# Patient Record
Sex: Male | Born: 1990
Health system: Southern US, Community
[De-identification: ages and names within clinical notes are randomized; demographics above are authoritative.]

## PROBLEM LIST (undated history)

## (undated) ENCOUNTER — Inpatient Hospital Stay: Payer: Self-pay

## (undated) DIAGNOSIS — F909 Attention-deficit hyperactivity disorder, unspecified type: Secondary | ICD-10-CM

## (undated) DIAGNOSIS — N200 Calculus of kidney: Secondary | ICD-10-CM

## (undated) HISTORY — PX: OTHER SURGICAL HISTORY: SHX169

---
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
## 2011-12-06 DIAGNOSIS — F900 Attention-deficit hyperactivity disorder, predominantly inattentive type: Secondary | ICD-10-CM | POA: Insufficient documentation

## 2012-06-11 ENCOUNTER — Other Ambulatory Visit: Payer: Self-pay | Admitting: Infectious Diseases

## 2012-06-11 ENCOUNTER — Other Ambulatory Visit (INDEPENDENT_AMBULATORY_CARE_PROVIDER_SITE_OTHER): Payer: 59

## 2012-06-11 DIAGNOSIS — Z0000 Encounter for general adult medical examination without abnormal findings: Secondary | ICD-10-CM

## 2012-06-11 NOTE — Addendum Note (Signed)
Addended by: Bufford Spikes on: 06/11/2012 03:42 PM   Modules accepted: Orders

## 2012-06-12 LAB — VARICELLA ZOSTER ANTIBODY, IGG: Varicella IgG: 3394 Index — ABNORMAL HIGH (ref <135.00)

## 2012-06-13 ENCOUNTER — Encounter (HOSPITAL_COMMUNITY): Payer: Self-pay | Admitting: *Deleted

## 2012-06-13 ENCOUNTER — Emergency Department (HOSPITAL_COMMUNITY)
Admission: EM | Admit: 2012-06-13 | Discharge: 2012-06-13 | Disposition: A | Discharge status: Home / Self Care | Payer: 59 | Source: Home / Self Care | Attending: Family Medicine | Admitting: Family Medicine

## 2012-06-13 DIAGNOSIS — S40269A Insect bite (nonvenomous) of unspecified shoulder, initial encounter: Secondary | ICD-10-CM

## 2012-06-13 DIAGNOSIS — W57XXXA Bitten or stung by nonvenomous insect and other nonvenomous arthropods, initial encounter: Secondary | ICD-10-CM

## 2012-06-13 HISTORY — DX: Calculus of kidney: N20.0

## 2012-06-13 MED ORDER — DOXYCYCLINE HYCLATE 100 MG PO CAPS: 100.0000 mg | ORAL_CAPSULE | Freq: Two times a day (BID) | ORAL | Status: DC

## 2012-06-13 NOTE — ED Provider Notes (Signed)
Medical screening examination/treatment/procedure(s) were performed by resident physician or non-physician practitioner and as supervising physician I was immediately available for consultation/collaboration.   KINDL,JAMES DOUGLAS MD.   James D Kindl, MD 06/13/12 2046 

## 2012-06-13 NOTE — ED Notes (Signed)
Pt here c/o rash on his RIGHT shoulder s/p removing a tick from same location x1 day ago. Pt reports tick may have been on this location x2 days - was outside then. Pt's father is pharmacist, removed tick, reports same appeared to be fully engorged. Rash is round, red, slightly raised, has no noted streaking from it, and measures 0.75cm in diameter. Denies fever.

## 2012-06-13 NOTE — ED Provider Notes (Signed)
History     CSN: [id number]  Arrival date & time 06/13/12  1703   First MD Initiated Contact with Patient 06/13/12 1846      Chief Complaint  Patient presents with  . Rash    (Consider location/radiation/quality/duration/timing/severity/associated sxs/prior treatment) HPI Comments: Pt's father removed an engorged tick from the back of his right shoulder yesterday. Pt unsure of how long tick had been there.  Today has small bull's eye rash in area of bite. Denies any other sx.   Patient is a 22 y.o. male presenting with rash. The history is provided by the patient.  Rash Location:  Shoulder/arm Shoulder/arm rash location:  R shoulder Quality: redness   Severity:  Mild Onset quality:  Unable to specify Timing:  Constant Progression:  Unchanged Chronicity:  New Context: insect bite/sting   Relieved by:  None tried Worsened by:  Nothing tried Ineffective treatments:  None tried Associated symptoms: no fever, no joint pain and no myalgias     Past Medical History  Diagnosis Date  . ADD (attention deficit disorder with hyperactivity)   . Kidney calculi     Past Surgical History  Procedure Laterality Date  . Basket uteroscopy  approx "2-3 years ago"    History reviewed. No pertinent family history.  History  Substance Use Topics  . Smoking status: Never Smoker   . Smokeless tobacco: Not on file  . Alcohol Use: Yes      Review of Systems  Constitutional: Negative for fever and chills.  Musculoskeletal: Negative for myalgias and arthralgias.  Skin: Positive for rash.    Allergies  Review of patient's allergies indicates no known allergies.  Home Medications   Current Outpatient Rx  Name  Route  Sig  Dispense  Refill  . amphetamine-dextroamphetamine (ADDERALL) 20 MG tablet   Oral   Take 20 mg by mouth daily as needed. Just takes when needs to study          . doxycycline (VIBRAMYCIN) 100 MG capsule   Oral   Take 1 capsule (100 mg total) by mouth 2  (two) times daily.   20 capsule   0     BP 109/66  Pulse 62  Temp(Src) 98.6 F (37 C) (Oral)  Resp 14  SpO2 100%  Physical Exam  Constitutional: He appears well-developed and well-nourished. No distress.  Skin: Skin is warm and dry. There is erythema.  Small 1cm rash area R shoulder. Center area is red, surrounded by area of clearing, and with a red border around the rash c/w in appearance with a bull's eye.     ED Course  Procedures (including critical care time)  Labs Reviewed - No data to display No results found.   1. Tick bite of shoulder, right, initial encounter       MDM  Pt asymptomatic. Rx doxycycline 100mg  BID #20.         Cathlyn Parsons, NP 06/13/12 [phone]

## 2012-08-15 ENCOUNTER — Encounter (INDEPENDENT_AMBULATORY_CARE_PROVIDER_SITE_OTHER): Payer: Self-pay

## 2012-08-15 ENCOUNTER — Ambulatory Visit (INDEPENDENT_AMBULATORY_CARE_PROVIDER_SITE_OTHER): Payer: Commercial Managed Care - PPO | Admitting: General Surgery

## 2012-08-15 ENCOUNTER — Encounter (INDEPENDENT_AMBULATORY_CARE_PROVIDER_SITE_OTHER): Payer: Self-pay | Admitting: General Surgery

## 2012-08-15 VITALS — BP 118/68 | HR 66 | Temp 99.0°F | Resp 16 | Ht 71.0 in | Wt 144.8 lb

## 2012-08-15 DIAGNOSIS — IMO0002 Reserved for concepts with insufficient information to code with codable children: Secondary | ICD-10-CM

## 2012-08-15 DIAGNOSIS — S76219A Strain of adductor muscle, fascia and tendon of unspecified thigh, initial encounter: Secondary | ICD-10-CM | POA: Insufficient documentation

## 2012-08-15 NOTE — Patient Instructions (Signed)
Avoid unbalanced heavy lifting for 6 weeks.
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
## 2014-04-02 ENCOUNTER — Encounter (HOSPITAL_COMMUNITY): Payer: Self-pay | Admitting: Psychiatry

## 2014-04-02 ENCOUNTER — Ambulatory Visit (INDEPENDENT_AMBULATORY_CARE_PROVIDER_SITE_OTHER): Payer: 59 | Admitting: Psychiatry

## 2014-04-02 ENCOUNTER — Encounter (INDEPENDENT_AMBULATORY_CARE_PROVIDER_SITE_OTHER): Payer: Self-pay

## 2014-04-02 VITALS — BP 142/71 | HR 74 | Ht 71.0 in | Wt 142.0 lb

## 2014-04-02 DIAGNOSIS — F4323 Adjustment disorder with mixed anxiety and depressed mood: Secondary | ICD-10-CM | POA: Diagnosis not present

## 2014-04-02 MED ORDER — CLONAZEPAM 0.5 MG PO TABS
ORAL_TABLET | ORAL | Status: DC
Start: 2014-04-02 — End: 2014-06-11
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
## 2014-05-04 DIAGNOSIS — F411 Generalized anxiety disorder: Secondary | ICD-10-CM | POA: Insufficient documentation

## 2014-06-11 ENCOUNTER — Ambulatory Visit (INDEPENDENT_AMBULATORY_CARE_PROVIDER_SITE_OTHER): Payer: 59 | Admitting: Psychiatry

## 2014-06-11 ENCOUNTER — Encounter (HOSPITAL_COMMUNITY): Payer: Self-pay | Admitting: Psychiatry

## 2014-06-11 VITALS — BP 111/74 | HR 56 | Ht 70.0 in | Wt 150.0 lb

## 2014-06-11 DIAGNOSIS — F4322 Adjustment disorder with anxiety: Secondary | ICD-10-CM | POA: Diagnosis not present

## 2014-06-11 DIAGNOSIS — F4323 Adjustment disorder with mixed anxiety and depressed mood: Secondary | ICD-10-CM

## 2014-06-11 NOTE — Progress Notes (Signed)
[facility] Progress Note  06/11/2014 10:07 AM Al DecantChristopher F Georgiou  MRN:  [id number] Subjective:  Doing great The patient did well on his trip in [place]. He is graduated from his college. He's going to take some more courses at [employer] to be prepared to apply to PA school at [employer]. The patient's mood is great. His anxiety is gone. The Klonopin was very helpful. At this time the patient has tapered himself down and is off the Klonopin. He is sleeping and eating well has good energy is positive and focused. He doesn't use any alcohol or use any drugs. He is very functional. He demonstrates no evidence of psychosis. 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 The patient wonders after PA school if he should've echo to medical school but he'll find out. The patient does very well at school and at this time is asymptomatic from psychiatric symptoms. I think his anxiety disorder was transient. Principal Problem: Adjustment disorder with anxious mood state Diagnosis:   Patient Active Problem List   Diagnosis Date Noted  . Groin strain-right [S39.011A] 08/15/2012  . CHEST PAIN [R07.9] 04/20/2009   Total Time spent with patient: 30 minutes   Past Medical History:  Past Medical History  Diagnosis Date  . ADD (attention deficit disorder with hyperactivity)   . Kidney calculi     Past Surgical History  Procedure Laterality Date  . Basket uteroscopy  approx "2-3 years ago"   Family History:  Family History  Problem Relation Age of Onset  . Cancer Mother     lung  . Cancer Maternal Grandfather     lung  . Anxiety disorder Father   . Depression Father   . Anxiety disorder Sister   . Depression Sister   . Dementia Maternal Grandmother   . Alcohol abuse Paternal Grandfather    Social History:  History  Alcohol Use  . 0.6 oz/week  . 1 Cans of beer per week    Comment: occasional     History  Drug Use No    History   Social History  .  Marital Status: Single    Spouse Name: N/A  . Number of Children: N/A  . Years of Education: N/A   Social History Main Topics  . Smoking status: Never Smoker   . Smokeless tobacco: Never Used  . Alcohol Use: 0.6 oz/week    1 Cans of beer per week     Comment: occasional  . Drug Use: No  . Sexual Activity: Yes    Birth Control/ Protection: Condom   Other Topics Concern  . None   Social History Narrative   Additional History:    Sleep: Good  Appetite:  Good   Assessment:   Musculoskeletal: Strength & Muscle Tone:  Gait & Station:  Patient leans:    Psychiatric Specialty Exam: Physical Exam  ROS  Blood pressure 111/74, pulse 56, height 5\' 10"  (1.778 m), weight 150 lb (68.04 kg).Body mass index is 21.52 kg/(m^2).  General Appearance: Casual  Eye Contact::  Good  Speech:  Clear and Coherent  Volume:  Normal  Mood:  Euthymic  Affect:  Congruent  Thought Process:  Coherent  Orientation:  Full (Time, Place, and Person)  Thought Content:  WDL  Suicidal Thoughts:  No  Homicidal Thoughts:  No  Memory:  NA  Judgement:  Good  Insight:  Good  Psychomotor Activity:  Normal  Concentration:  Good  Recall:  Dudley MajorGood  Fund of Knowledge:Good  Language: Good  Akathisia:  No  Handed:  Right  AIMS (if indicated):     Assets:  Desire for Improvement  ADL's:  Intact  Cognition: WNL  Sleep:        Current Medications: Current Outpatient Prescriptions  Medication Sig Dispense Refill  . amphetamine-dextroamphetamine (ADDERALL) 20 MG tablet Take 20 mg by mouth daily as needed. Just takes when needs to study     . clonazePAM (KLONOPIN) 0.5 MG tablet 1 bid for  1 week then may increase to 2  bid 120 tablet 3   No current facility-administered medications for this visit.    Lab Results: No results found for this or any previous visit (from the past 48 hour(s)).  Physical Findings: AIMS:  , ,  ,  ,    CIWA:    COWS:     Treatment Plan Summary: At this time the patient  is off Klonopin. Is not on any psychotropic medications. The patient still plans to make an appointment with his previous therapist Dr. Elijah Birkom heading as he prepares to go ultimately to a PA program. The patient has good spirits. He is logical reasonable is not suicidal not psychotic. Today my care ended with this patient and he was told if things recur he can ice come back to be seen again.   Medical Decision Making:  Established Problem, Stable/Improving (1)     Eilleen Davoli [name] 06/11/2014, 10:07 AM

## 2014-09-22 ENCOUNTER — Encounter (HOSPITAL_COMMUNITY): Payer: 59 | Admitting: Psychiatry

## 2014-11-03 ENCOUNTER — Ambulatory Visit (INDEPENDENT_AMBULATORY_CARE_PROVIDER_SITE_OTHER): Payer: 59 | Admitting: Psychiatry

## 2014-11-03 VITALS — BP 106/72 | HR 66 | Resp 16 | Wt 153.8 lb

## 2014-11-03 DIAGNOSIS — F4323 Adjustment disorder with mixed anxiety and depressed mood: Secondary | ICD-10-CM

## 2014-11-03 DIAGNOSIS — Z634 Disappearance and death of family member: Secondary | ICD-10-CM | POA: Diagnosis not present

## 2014-11-03 MED ORDER — CLONAZEPAM 0.5 MG PO TABS: ORAL_TABLET | ORAL | Status: DC
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
## 2014-11-24 NOTE — Progress Notes (Signed)
This encounter was created in error - please disregard.

## 2015-02-14 DIAGNOSIS — F4323 Adjustment disorder with mixed anxiety and depressed mood: Secondary | ICD-10-CM | POA: Diagnosis not present

## 2015-02-21 DIAGNOSIS — F4323 Adjustment disorder with mixed anxiety and depressed mood: Secondary | ICD-10-CM | POA: Diagnosis not present

## 2015-02-28 DIAGNOSIS — F4323 Adjustment disorder with mixed anxiety and depressed mood: Secondary | ICD-10-CM | POA: Diagnosis not present

## 2015-03-14 DIAGNOSIS — F4323 Adjustment disorder with mixed anxiety and depressed mood: Secondary | ICD-10-CM | POA: Diagnosis not present

## 2015-03-28 DIAGNOSIS — F4323 Adjustment disorder with mixed anxiety and depressed mood: Secondary | ICD-10-CM | POA: Diagnosis not present

## 2015-04-18 DIAGNOSIS — F4323 Adjustment disorder with mixed anxiety and depressed mood: Secondary | ICD-10-CM | POA: Diagnosis not present

## 2015-04-25 DIAGNOSIS — F4323 Adjustment disorder with mixed anxiety and depressed mood: Secondary | ICD-10-CM | POA: Diagnosis not present

## 2015-05-03 DIAGNOSIS — F4323 Adjustment disorder with mixed anxiety and depressed mood: Secondary | ICD-10-CM | POA: Diagnosis not present

## 2015-05-24 MED FILL — DEXTROAMP-AMPHET ER 20 MG C: 20 | 30 days supply | Qty: 30 | Fill #0

## 2015-05-24 MED FILL — DEXTROAMP-AMP 10 MG TAB: 10 | 30 days supply | Qty: 30 | Fill #0

## 2015-06-28 DIAGNOSIS — Z0000 Encounter for general adult medical examination without abnormal findings: Secondary | ICD-10-CM | POA: Diagnosis not present

## 2015-06-28 DIAGNOSIS — R0602 Shortness of breath: Secondary | ICD-10-CM | POA: Diagnosis not present

## 2015-06-28 DIAGNOSIS — E559 Vitamin D deficiency, unspecified: Secondary | ICD-10-CM | POA: Diagnosis not present

## 2015-06-28 DIAGNOSIS — R5383 Other fatigue: Secondary | ICD-10-CM | POA: Diagnosis not present

## 2015-07-21 MED FILL — DEXTROAMP-AMP 10 MG TAB: 10 | 30 days supply | Qty: 30 | Fill #0

## 2015-07-21 MED FILL — DEXTROAMP-AMPHET ER 20 MG C: 20 | 30 days supply | Qty: 30 | Fill #0

## 2015-07-28 ENCOUNTER — Ambulatory Visit (INDEPENDENT_AMBULATORY_CARE_PROVIDER_SITE_OTHER): Payer: 59 | Admitting: Licensed Clinical Social Worker

## 2015-07-28 ENCOUNTER — Telehealth (HOSPITAL_COMMUNITY): Payer: Self-pay

## 2015-07-28 DIAGNOSIS — F4310 Post-traumatic stress disorder, unspecified: Secondary | ICD-10-CM | POA: Diagnosis not present

## 2015-07-28 DIAGNOSIS — F4323 Adjustment disorder with mixed anxiety and depressed mood: Secondary | ICD-10-CM

## 2015-07-28 DIAGNOSIS — F1021 Alcohol dependence, in remission: Secondary | ICD-10-CM | POA: Insufficient documentation

## 2015-07-28 DIAGNOSIS — F1020 Alcohol dependence, uncomplicated: Secondary | ICD-10-CM | POA: Diagnosis not present
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
## 2015-07-28 NOTE — Telephone Encounter (Signed)
Patients father called, he states that the Klonopin prescription given to his son in October has run out and he would like a refill. I explained that we had not seen the patient since October of 2016 and that he would need to make a follow up appointment. The patient is here today to see therapist Beth. Please review and advise, thank you

## 2015-07-28 NOTE — Telephone Encounter (Signed)
I spoke with dr. Donell BeersPlovsky and he said he would need to see the patient in office before refilling Klonopin. I called patient and he voiced his understanding

## 2015-08-01 ENCOUNTER — Ambulatory Visit (HOSPITAL_COMMUNITY): Admission: RE | Admit: 2015-08-01 | Payer: 59 | Source: Home / Self Care | Admitting: Psychiatry

## 2015-08-01 ENCOUNTER — Other Ambulatory Visit (HOSPITAL_COMMUNITY): Payer: Self-pay

## 2015-08-01 DIAGNOSIS — F4323 Adjustment disorder with mixed anxiety and depressed mood: Secondary | ICD-10-CM

## 2015-08-01 MED ORDER — CLONAZEPAM 0.5 MG PO TABS
ORAL_TABLET | ORAL | Status: DC
Start: 2015-08-01 — End: 2015-08-03

## 2015-08-01 MED FILL — clonazePAM 0.5 MG TABS: 0.5 | 5 days supply | Qty: 5 | Fill #0

## 2015-08-01 NOTE — Telephone Encounter (Signed)
Patient was a walk in upstairs - he did not meet qualifications to be inpatient, but they called and asked if Dr. Donell BeersPlovsky would give him enough medication until his appointment. I called dr. Donell BeersPlovsky who approved enough Klonopin to get through until Friday when he has his follow up. I called in a prescription for 5 tablets to be [facility] once a [facility]. Patient is aware and states that he will be here on Friday.

## 2015-08-03 ENCOUNTER — Ambulatory Visit (INDEPENDENT_AMBULATORY_CARE_PROVIDER_SITE_OTHER): Payer: Self-pay | Admitting: Licensed Clinical Social Worker

## 2015-08-03 ENCOUNTER — Ambulatory Visit (INDEPENDENT_AMBULATORY_CARE_PROVIDER_SITE_OTHER): Payer: 59 | Admitting: Psychiatry

## 2015-08-03 ENCOUNTER — Encounter (HOSPITAL_COMMUNITY): Payer: Self-pay | Admitting: Psychiatry

## 2015-08-03 VITALS — BP 120/72 | HR 61 | Ht 70.0 in | Wt 140.4 lb

## 2015-08-03 DIAGNOSIS — F4323 Adjustment disorder with mixed anxiety and depressed mood: Secondary | ICD-10-CM

## 2015-08-03 DIAGNOSIS — F1020 Alcohol dependence, uncomplicated: Secondary | ICD-10-CM

## 2015-08-03 MED ORDER — PROPRANOLOL HCL 10 MG PO TABS: 10.0000 mg | ORAL_TABLET | Freq: Two times a day (BID) | ORAL | Status: DC

## 2015-08-03 MED ORDER — CLONAZEPAM 0.5 MG PO TABS: ORAL_TABLET | ORAL | Status: DC

## 2015-08-03 MED FILL — PROPRANOLOL 10 MG TABLET: 10 | 30 days supply | Qty: 60 | Fill #0
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
## 2015-08-03 NOTE — Progress Notes (Signed)
Patient ID: Derek Lindsey, male   DOB: 1990/09/17, 25 y.o.   MRN: [id number] [facility] MD Progress Note  08/03/2015 2:30 PM Derek Lindsey  MRN:  [id number] Subjective: Better Principal Problem: Uncomplicated bereavement Diagnosis:  Uncomplicated bereavement/ adjustment disorder with depressed mood state Today the patient is seen in somewhat of an urgent visit. The patient was doing well up to a couple weeks ago when his girlfriend said his alcohol abuse was more that she could handle. He apparently binges and has blackouts and is very aggressive and inappropriate during the times he is intoxicated. Apparently this is happening multiple times. In typical of alcohol dependency this patient is made multiple efforts and attempts to stop drinking and made promises to his girlfriend and fail. She says she's had enough and wants to take a break. She's not entirely sure she wants to end a relationship but she wants some space. The concept of having said he would've an indeterminate ending point is making the patient extremely anxious. He is so anxious that every night he has to run 10 miles to deal with his distress. He does very little other than obsessed about Chantel his girlfriend. He's been with her for about 1-1/2 years. They have contemplated marriage. He loves her great deal and claims he can communicate and negotiate and he does trust her. The patient is still planning to get a PA school presently works for [employer]. The patient is never had a DUI. The patient admitted that he did not tell me the truth on her last visit that in fact he was drinking. The patient also smokes marijuana when she recently has stopped a few days ago. He says at this time his girlfriend resistant to stop all alcohol and marijuana and he is willing to try. At this time he goes to [place] 5 times a week has a sponsor and is starting in therapy with our substance abuse therapist. The patient's problem is that his good friends drink  all the time. The patient denies any psychotic symptoms. It should be clearly noted the patient is not suicidal. He met his father on the roof top at the local hospital only because that's where his father works and that's where he got to see him. He was not attempting to jump or hurt himself. The patient is very stressed out. Patient Active Problem List   Diagnosis Date Noted  . Alcohol dependence ([place]) [F10.20] 07/28/2015  . Alcohol use disorder, severe, dependence ([place]) [F10.20] 07/28/2015  . Groin strain-right [S39.011A] 08/15/2012  . CHEST PAIN [R07.9] 04/20/2009   Total Time spent with patient: 15 minutes  Past Psychiatric History:   Past Medical History:  Past Medical History  Diagnosis Date  . ADD (attention deficit disorder with hyperactivity)   . Kidney calculi     Past Surgical History  Procedure Laterality Date  . Basket uteroscopy  approx "2-3 years ago"   Family History:  Family History  Problem Relation Age of Onset  . Cancer Mother     lung  . Cancer Maternal Grandfather     lung  . Anxiety disorder Father   . Depression Father   . Anxiety disorder Sister   . Depression Sister   . Dementia Maternal Grandmother   . Alcohol abuse Paternal Grandfather    Family Psychiatric  History:  Social History:  History  Alcohol Use  . 0.6 oz/week  . 1 Cans of beer per week    Comment: occasional  History  Drug Use No    Social History   Social History  . Marital Status: Single    Spouse Name: N/A  . Number of Children: N/A  . Years of Education: N/A   Social History Main Topics  . Smoking status: Never Smoker   . Smokeless tobacco: Never Used  . Alcohol Use: 0.6 oz/week    1 Cans of beer per week     Comment: occasional  . Drug Use: No  . Sexual Activity: Yes    Birth Control/ Protection: Condom   Other Topics Concern  . None   Social History Narrative   Additional Social History:                         Sleep: Good  Appetite:   Good  Current Medications: Current Outpatient Prescriptions  Medication Sig Dispense Refill  . amphetamine-dextroamphetamine (ADDERALL) 20 MG tablet Take 20 mg by mouth daily as needed. Just takes when needs to study     . clonazePAM (KLONOPIN) 0.5 MG tablet 1 po qd prn anxiety 30 tablet 1  . propranolol (INDERAL) 10 MG tablet Take 1 tablet (10 mg total) by mouth 2 (two) times daily. 60 tablet 2   No current facility-administered medications for this visit.    Lab Results: No results found for this or any previous visit (from the past 48 hour(s)).  Physical Findings: AIMS:  , ,  ,  ,    CIWA:    COWS:     Musculoskeletal: Strength & Muscle Tone: within normal limits Gait & Station: normal Patient leans: Right  Psychiatric Specialty Exam: ROS  Blood pressure 120/72, pulse 61, height '5\' 10"'  (1.778 m), weight 140 lb 6.4 oz (63.685 kg).Body mass index is 20.15 kg/(m^2).  General Appearance: Casual  Eye Contact::  Good  Speech:  Clear and Coherent  Volume:  Normal  Mood: Anxious   Affect:  Appropriate  Thought Process:  Coherent  Orientation:  NA  Thought Con normal   Suicidal Thoughts:  No  Homicidal Thoughts:  No  Memory:  NA  Judgement:  Good  Insight:  Good  Psychomotor Activity:  Normal  Concentration:  Good  Recall:  Good  Fund of Knowledge:Good  Language: Good  Akathisia:  No  Handed:  Right  AIMS (if indicated):     Assets:  Desire for Improvement  ADL's:  Intact  Cognition: WNL  Sleep:      Treatment Plan Summary: At this time this patient is no longer seeing Dr. heading. Today the patient will begin seeing Beth McKinzie. It is noted that over the last few days he did begin small dose of Klonopin which did help him sleep. Sleeping is very important for this individual. He does feel constant anxiety but I do think it's crisis like anxiety. I think the best diagnosis is adjustment disorder with anxious mood state in addition to alcohol abuse (substance abuse  disorder). My recommendations is that is strongly encouraged him to stay in [place] through the week, may contact with a sponsor continue in therapy and that he take a very small dose of Klonopin 0.5 mg just at night for sleep. I will also begin him on propranolol 10 mg twice a day as an antianxiety medicine to be taken during the day. I did encourage him to be clear with his girlfriend that he was dedicated to try to stay sober but to tell her how she could help  him. The best weight seems is to clearly have a defined time whether is in a week or 2 weeks where they can make phone call contact. The idea of not having any more contact and not knowing his driving this patients anxiety. Today that he still will be hearing from her I think is reasonable. This patient is not suicidal. He is not psychotic. He's going to be speaking to his girlfriend in 3 days we will discuss the future with her. He'll have a return appointment to see me in 5 weeks. Charlestine Night Bobbiejo Ishikawa 08/03/2015, 2:30 PM

## 2015-08-04 MED FILL — clonazePAM 0.5 MG TABS: 0.5 | 30 days supply | Qty: 30 | Fill #0

## 2015-08-05 ENCOUNTER — Ambulatory Visit (HOSPITAL_COMMUNITY): Payer: Self-pay | Admitting: Psychiatry

## 2015-08-08 ENCOUNTER — Encounter (HOSPITAL_COMMUNITY): Payer: Self-pay

## 2015-08-08 ENCOUNTER — Ambulatory Visit (INDEPENDENT_AMBULATORY_CARE_PROVIDER_SITE_OTHER): Payer: 59 | Admitting: Licensed Clinical Social Worker

## 2015-08-08 DIAGNOSIS — F4323 Adjustment disorder with mixed anxiety and depressed mood: Secondary | ICD-10-CM

## 2015-08-08 DIAGNOSIS — F1020 Alcohol dependence, uncomplicated: Secondary | ICD-10-CM

## 2015-08-08 NOTE — Progress Notes (Signed)
   THERAPIST PROGRESS NOTE  Session Time: 2:10-3:00  Participation Level: Active  Behavioral Response: CasualAlertAnxious  Type of Therapy: Individual Therapy  Treatment Goals addressed: Coping  Interventions: CBT and Supportive  Summary: Derek DecantChristopher F Lindsey is a 25 y.o. male who presents with anxiety, depression and alcohol use disorder. Pt and girlfriend are beginning to talk again which is less anxiety for pt. He seemed move calm today. Pt read some of the materials I gave him last week on co-dependency. He has identified that as a goal he wants to work on. Discussed with pt the possiblity of CD-IOP. He is not interested in it now due to time constraints. He will continue to work on his abstinance of alcohol and marijuana along with his depression and anxiety. Another goal pt has identified is self-esteem. Gave pt self esteem worksheet.   Suicidal/Homicidal: No  Therapist Response: Suggested CD-IOP to pt. And he was resistant. Will monitor pt's use of AOD. Pt is required to go to 4 AA meetings per week and get a sponsor. Discussed with pt issues with co-dependency and the goal is for pt to become well on his own. Will continue to work on coping skills for depression and anxiety instead of using AOD. Pt is dropping out of [employer] for this semester and will complete a letter for him.  Plan: Return again in 1 weeks.  Diagnosis: Axis I: AUD, adustment disorder with anxiety and depression    Axis II: NO DX    Mc Hollen S, Licensed Cli 08/08/2015

## 2015-08-10 ENCOUNTER — Telehealth (HOSPITAL_COMMUNITY): Payer: Self-pay | Admitting: Licensed Clinical Social Worker

## 2015-08-15 ENCOUNTER — Ambulatory Visit (HOSPITAL_COMMUNITY): Payer: Self-pay | Admitting: Licensed Clinical Social Worker

## 2015-08-16 ENCOUNTER — Ambulatory Visit (INDEPENDENT_AMBULATORY_CARE_PROVIDER_SITE_OTHER): Payer: 59 | Admitting: Licensed Clinical Social Worker

## 2015-08-16 DIAGNOSIS — F1020 Alcohol dependence, uncomplicated: Secondary | ICD-10-CM

## 2015-08-16 DIAGNOSIS — F4323 Adjustment disorder with mixed anxiety and depressed mood: Secondary | ICD-10-CM | POA: Diagnosis not present

## 2015-08-16 NOTE — Progress Notes (Signed)
   THERAPIST PROGRESS NOTE  Session Time: 9:20-10:10am  Participation Level: Active  Behavioral Response: CasualAlertAnxious  Type of Therapy: Individual Therapy  Treatment Goals addressed: Anxiety, depression, coping  Interventions: CBT, Motivational Interviewing and Supportive  Summary: Derek Lindsey is a 25 y.o. male who presents with anxiety and depression.  Suicidal/Homicidal: Nowithout intent/plan  Therapist Response: Pt presented anxious. Pt is due to see girlfriend Saturday [place]. He is anxious about the meeting. Discussed with pt co-dependency. Pt acknowledges it. Pt is resistant to change. He wants to control his relationship. Worked with pt on control issues. Discussed anxiety with pt and his coping skills. Pt is not interested in any coping skill where he is not active. He running 9-10 miles so that he can sleep. Pt is interested in trazadone for sleep. Will see if I can ask Dr. Donell BeersPlovsky about this medication. Pt is running, hiking, kayaking dealing with his anxiety. Pt's main focus is on his relationship; when he is redirected to other areas of his life, he immediately goes back to his girlfriend. Worked with pt on the definition of working on himself. Pt has not drank in over a week but continues to smoke marijuana daily. He uses this as a coping skill for his anxiety.  Plan: Return again in 2 weeks.  Diagnosis: Axis I: Adjustment Disorder with Depressed Mood and Adjustment Disorder with Anxiety    Axis II: No DX    MACKENZIE,LISBETH S, Licensed Cli 08/16/2015

## 2015-08-18 ENCOUNTER — Ambulatory Visit (HOSPITAL_COMMUNITY): Payer: Self-pay | Admitting: Licensed Clinical Social Worker

## 2015-08-19 MED FILL — DEXTROAMP-AMPHET ER 20 MG C: 20 | 30 days supply | Qty: 30 | Fill #0

## 2015-08-19 MED FILL — DEXTROAMP-AMP 10 MG TAB: 10 | 30 days supply | Qty: 30 | Fill #0

## 2015-08-31 ENCOUNTER — Ambulatory Visit (INDEPENDENT_AMBULATORY_CARE_PROVIDER_SITE_OTHER): Payer: 59 | Admitting: Licensed Clinical Social Worker

## 2015-08-31 DIAGNOSIS — F4323 Adjustment disorder with mixed anxiety and depressed mood: Secondary | ICD-10-CM

## 2015-09-01 ENCOUNTER — Telehealth (HOSPITAL_COMMUNITY): Payer: Self-pay | Admitting: Licensed Clinical Social Worker

## 2015-09-01 NOTE — Progress Notes (Signed)
   THERAPIST PROGRESS NOTE  Session Time: 3:00-3:50pm  Participation Level: Active  Behavioral Response: CasualAlertAnxious  Type of Therapy: Individual Therapy  Treatment Goals addressed: Coping  Interventions: CBT and Supportive  Summary: Derek Lindsey is a 25 y.o. male who presents with depression and anxiety. Pt reports he has not drank in over 30 days. He has also not smoked week in 5 days. He normally smokes week regularly. Pt is still in limbo with his girlfriend. Which is the biggest stressor for him. Role played with pt effective communication skills to use with his girlfriend. Pt will start school at [employer] in 2 weeks. 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 Pt is struggling with the relationship with his parents. Discussed options of living at home. Worked with pt on boundaries andcommunication skills to use with his parents. Gave pt homework assignment: Who you really need to marry."  Suicidal/Homicidal: Nowithout intent/plan  Therapist Response: Assessed pt's current functioning and reviewed progress. Assisted pt processing issues with parents, girlfriend and the management of his stressors.  Plan: Return again in 1 weeks.  Diagnosis: Axis I: Adjustment Disorder with depression and anxiety    Axis II: No DX    MACKENZIE,LISBETH S, LCAS-A 09/01/2015

## 2015-09-02 ENCOUNTER — Encounter (HOSPITAL_COMMUNITY): Payer: Self-pay

## 2015-09-02 MED FILL — clonazePAM 0.5 MG TABS: 0.5 | 30 days supply | Qty: 30 | Fill #1

## 2015-09-07 ENCOUNTER — Encounter (HOSPITAL_COMMUNITY): Payer: Self-pay | Admitting: Psychiatry

## 2015-09-07 ENCOUNTER — Ambulatory Visit (INDEPENDENT_AMBULATORY_CARE_PROVIDER_SITE_OTHER): Payer: 59 | Admitting: Psychiatry

## 2015-09-07 ENCOUNTER — Telehealth (HOSPITAL_COMMUNITY): Payer: Self-pay | Admitting: Licensed Clinical Social Worker

## 2015-09-07 VITALS — BP 110/68 | HR 67 | Ht 71.0 in | Wt 142.4 lb

## 2015-09-07 DIAGNOSIS — F4322 Adjustment disorder with anxiety: Secondary | ICD-10-CM

## 2015-09-07 MED ORDER — HYDROXYZINE PAMOATE 25 MG PO CAPS: ORAL_CAPSULE | ORAL | 4 refills | Status: DC

## 2015-09-07 MED FILL — HYDROXYZINE PAM 25 MG CAP: 25 | 60 days supply | Qty: 60 | Fill #0

## 2015-09-07 NOTE — Progress Notes (Signed)
Patient ID: Derek Lindsey, male   DOB: 04/27/1990, 25 y.o.   MRN: [id number] [facility] Progress Note  09/07/2015 3:30 PM Derek Lindsey  MRN:  [id number] Subjective: Better Principal Problem: Uncomplicated bereavement At this time the patient is much calmer. He is back with his girlfriend. He feels a lot of relief. He says he goes to AA 5 times a week. He stopped marijuana about a week ago. It is curious that he stopped only a week ago. I doubt will keep away from marijuana maybe from alcohol. He says he is getting a benefit by being in therapy in the setting. The Klonopin is done and generally sleeping and eating well. He has sometimes has difficulty sleeping. His anxiety level is much better. He's no longer is desperate no longer is depressed. He says he is in therapy with his girlfriend. Total Time spent with patient: 15 minutes  Past Psychiatric History:   Past Medical History:  Past Medical History:  Diagnosis Date  . ADD (attention deficit disorder with hyperactivity)   . Kidney calculi     Past Surgical History:  Procedure Laterality Date  . Basket Uteroscopy  approx "2-3 years ago"   Family History:  Family History  Problem Relation Age of Onset  . Cancer Mother     lung  . Cancer Maternal Grandfather     lung  . Anxiety disorder Father   . Depression Father   . Anxiety disorder Sister   . Depression Sister   . Dementia Maternal Grandmother   . Alcohol abuse Paternal Grandfather    Family Psychiatric  History:  Social History:  History  Alcohol Use  . 0.6 oz/week  . 1 Cans of beer per week    Comment: occasional     History  Drug Use No    Social History   Social History  . Marital status: Single    Spouse name: N/A  . Number of children: N/A  . Years of education: N/A   Social History Main Topics  . Smoking status: Never Smoker  . Smokeless tobacco: Never Used  . Alcohol use 0.6 oz/week    1 Cans of beer per week     Comment: occasional  . Drug  use: No  . Sexual activity: Yes    Birth control/ protection: Condom   Other Topics Concern  . None   Social History Narrative  . None   Additional Social History:                         Sleep: Good  Appetite:  Good  Current Medications: Current Outpatient Prescriptions  Medication Sig Dispense Refill  . amphetamine-dextroamphetamine (ADDERALL) 20 MG tablet Take 20 mg by mouth daily as needed. Just takes when needs to study     . clonazePAM (KLONOPIN) 0.5 MG tablet 1 po qd prn anxiety 30 tablet 1  . hydrOXYzine (VISTARIL) 25 MG capsule 1  qhs  May repeat 60 capsule 4  . propranolol (INDERAL) 10 MG tablet Take 1 tablet (10 mg total) by mouth 2 (two) times daily. 60 tablet 2   No current facility-administered medications for this visit.     Lab Results: No results found for this or any previous visit (from the past 48 hour(s)).  Physical Findings: AIMS:  , ,  ,  ,    CIWA:    COWS:     Musculoskeletal: Strength & Muscle Tone: within normal limits Gait &  Station: normal Patient leans: Right  Psychiatric Specialty Exam: ROS  Blood pressure 110/68, pulse 67, height  (1.803 m), weight 142 lb 6.4 oz (64.6 kg).Body mass index is 19.86 kg/m.  General Appearance: Casual  Eye Contact::  Good  Speech:  Clear and Coherent  Volume:  Normal  Mood: Anxious   Affect:  Appropriate  Thought Process:  Coherent  Orientation:  NA  Thought Con normal   Suicidal Thoughts:  No  Homicidal Thoughts:  No  Memory:  NA  Judgement:  Good  Insight:  Good  Psychomotor Activity:  Normal  Concentration:  Good  Recall:  Good  Fund of Knowledge:Good  Language: Good  Akathisia:  No  Handed:  Right  AIMS (if indicated):     Assets:  Desire for Improvement  ADL's:  Intact  Cognition: WNL  Sleep:      Treatment Plan Summary: 09/07/2015, 3:30 PM Patient ID: Derek Decant, male   DOB: 01/17/1991, 25 y.o.   MRN: [id number]  At this time he'll continue in therapy and he  she'll be given a prescription for Vistaril 25 mg when necessary for sleep. He'll return to see me in 5 months. The patient is a longer anxious and is trying to work when his substance abuse issues.

## 2015-09-08 ENCOUNTER — Ambulatory Visit (HOSPITAL_COMMUNITY): Payer: Self-pay | Admitting: Licensed Clinical Social Worker

## 2015-09-08 NOTE — Telephone Encounter (Signed)
Pt called to set up an appt. He had to cancel the appt for tomorrow due to work.    By Vernona RiegerLisbeth S Mackenzie, LCAS-A

## 2015-09-19 ENCOUNTER — Encounter (HOSPITAL_COMMUNITY): Payer: Self-pay | Admitting: Licensed Clinical Social Worker

## 2015-09-19 ENCOUNTER — Ambulatory Visit (INDEPENDENT_AMBULATORY_CARE_PROVIDER_SITE_OTHER): Payer: 59 | Admitting: Licensed Clinical Social Worker

## 2015-09-19 DIAGNOSIS — F4322 Adjustment disorder with anxiety: Secondary | ICD-10-CM | POA: Diagnosis not present

## 2015-09-19 NOTE — Progress Notes (Signed)
   THERAPIST PROGRESS NOTE  Session Time: 1:10-2:00  Participation Level: Active  Behavioral Response: CasualAlertDepressed  Type of Therapy: Individual Therapy  Treatment Goals addressed: Coping  Interventions: CBT and Supportive  Summary: Derek Lindsey is a [age]Al Decant y.o. male. Pt reports he drank 1 beer a week ago when he went out with friends (minimizing?) He has also not smoked week in 45 days. This is quite the behavior changed considering he used to smoke almost daily. He reports he feels better, can sleep better and is less anxious since he stopped his use of marijuana. DIscussed at length the 2 relationships which are causing him distress: parents and girlfriend. Role played with pt effective communication skills to use with his girlfriend and parents. Encouraged pt to problem-solve the dilemmas with each stressor, with alternative solutions.. Pt is back in school at [employer]. Discussed the homework assignment: Who you really need to marry." Pt participated in a discussion on the impact of the Ted Talk.   Suicidal/Homicidal: Nowithout intent/plan  Therapist Response: Assessed pt's current functioning. Processed with pt's current stressors and processed with pt the relationship with his parents, his girlfriend and himself.  Plan: Return again in 1 weeks using CBT-based therapy  Diagnosis: Axis I: Adjustment Disorder with anxious mood        Makella Buckingham S, LCAS-A 09/19/2015

## 2015-09-27 ENCOUNTER — Ambulatory Visit (INDEPENDENT_AMBULATORY_CARE_PROVIDER_SITE_OTHER): Payer: 59 | Admitting: Licensed Clinical Social Worker

## 2015-09-27 DIAGNOSIS — F4323 Adjustment disorder with mixed anxiety and depressed mood: Secondary | ICD-10-CM

## 2015-09-28 ENCOUNTER — Encounter (HOSPITAL_COMMUNITY): Payer: Self-pay | Admitting: Licensed Clinical Social Worker

## 2015-09-28 NOTE — Progress Notes (Signed)
   THERAPIST PROGRESS NOTE  Session Time: 3:10-4pm  Participation Level: Active  Behavioral Response: CasualAlertDepressed  Type of Therapy: Individual Therapy  Treatment Goals addressed: Coping  Interventions: Solution Focused  Summary: Derek Lindsey is a 25 y.o. male who presents with depression. He reports he is struggling with his life currently. He is not happy with his current situation, [employer] classes hard, living at home unbearable, not working (no time), no $, relationship with girlfriend strained. Common theme with pt is he is unhappy with his life. Discussed compartmentalizing with pt so he does not feel overwhelmed. Suggested pt to problem solve with a plan. Discussed alternatives. Pt was open to suggestions.  Suicidal/Homicidal: Nowithout intent/plan  Therapist Response: Assessed pt's current functioning and reviewed progress. Assisted pt processing issues with family, school, relationship and processing for management of stressors.  Plan: Return again in 2 weeks.  Diagnosis: Axis I: Adjustment disorder with mixed anxiety and depressed mood        MACKENZIE,LISBETH S, LCAS-A 09/28/2015

## 2015-10-11 ENCOUNTER — Ambulatory Visit (INDEPENDENT_AMBULATORY_CARE_PROVIDER_SITE_OTHER): Payer: 59 | Admitting: Licensed Clinical Social Worker

## 2015-10-11 DIAGNOSIS — F4323 Adjustment disorder with mixed anxiety and depressed mood: Secondary | ICD-10-CM

## 2015-10-12 ENCOUNTER — Encounter (HOSPITAL_COMMUNITY): Payer: Self-pay | Admitting: Licensed Clinical Social Worker

## 2015-10-12 NOTE — Progress Notes (Signed)
   THERAPIST PROGRESS NOTE  Session Time: 9:10-10:00am  Participation Level: Active  Behavioral Response: CasualAlertEuthymic  Type of Therapy: Individual Therapy  Treatment Goals addressed: Coping  Interventions: CBT and Supportive  Summary: Derek Lindsey is a 25 y.o. male who presents with anxiety and depressive symptoms. Pt is less symtomatic today. He reports that he and his girlfriend are back together. Pt talked about current happiness. He is miserable living with his parents and they both communicate pasive-aggressively. Worked with pt on Manufacturing systems engineercommunication skills. Pt got a new dog and is spending a lot of time training the dog. He has [facility] [employer] and they both commiserate about "things at home." Pt is struggling in school. DIscussed with pt long-term goals. Suggested to the pt to make short-term goals so he can see successes. Pt drank 1 beer at a party 2 weeks ago. That is the first beer in many weeks. Discussed triggers with pt..   Suicidal/Homicidal: Nowithout intent/plan  Therapist Response: Assessed pt's current functioning and reviewed progress. Assisted pt processing issues with parents, girfriend, school and processing for management of stressors.  Plan: Return again in 2 weeks.  Diagnosis: Axis I: F43.23    Axis II:     MACKENZIE,LISBETH S, LCAS-A 10/12/2015

## 2015-10-19 ENCOUNTER — Ambulatory Visit (INDEPENDENT_AMBULATORY_CARE_PROVIDER_SITE_OTHER): Payer: 59 | Admitting: Licensed Clinical Social Worker

## 2015-10-19 DIAGNOSIS — F4322 Adjustment disorder with anxiety: Secondary | ICD-10-CM

## 2015-10-20 ENCOUNTER — Encounter (HOSPITAL_COMMUNITY): Payer: Self-pay | Admitting: Licensed Clinical Social Worker

## 2015-10-20 NOTE — Progress Notes (Signed)
Met with pt's father to review progress of pt. Father says pt's mood has improved considerably. He says pt becomes defensive when he tries to take over things for pt. Father says pt has shown no signs of alcohol or marijuana use. Father says pt and his mother are getting along better with communicating. Reviewed with father communication skills and suggestions pt has been working on. Father indicated issues pt may need to work on: anger with mother, guilt over friend's suicide. Father was encouraged about the positive progress of pt.   Length of session: 50 minutes.  Jenkins Rouge, LCAS-A

## 2015-11-01 ENCOUNTER — Ambulatory Visit (INDEPENDENT_AMBULATORY_CARE_PROVIDER_SITE_OTHER): Payer: 59 | Admitting: Licensed Clinical Social Worker

## 2015-11-01 DIAGNOSIS — F4323 Adjustment disorder with mixed anxiety and depressed mood: Secondary | ICD-10-CM | POA: Diagnosis not present

## 2015-11-07 ENCOUNTER — Encounter (HOSPITAL_COMMUNITY): Payer: Self-pay | Admitting: Licensed Clinical Social Worker

## 2015-11-07 NOTE — Progress Notes (Signed)
THERAPIST PROGRESS NOTE  Session Time: 2::10-3:00pm  Participation Level: Active  Behavioral Response: CasualAlertEuthymic  Type of Therapy: Individual Therapy  Treatment Goals addressed: Coping  Interventions: CBT and Supportive  Summary: Al DecantChristopher F Suarez is a 25 y.o. male who presents upbeat and ready for therapy Pt is less symtomatic today. He reports that he and his girlfriend are back together and doing well. Processed with pt the progress he has made while in therapy, basically getting him off the ledge of depression and anxiety. Asked pt what he would like to work on now. Suggested to pt to make short-term goals so he can see his successes.  Pt chose self confidence and anger management skills. Pt is still enjoying his new puppy which brings him joy. Pt is doing better in school and admonished himself for almost quitting school and giving up his dream due to struggling in 1 class. Suggested to pt that may be his old wiay of coping in difficult situations. Processed with pt alternative ways to deal with difficult situations other than quitting completely. Pt was open and receptive during the intervention.  Suicidal/Homicidal: Nowithout intent/plan  Therapist Response: Assessed pt's current functioning and reviewed progress. Assisted pt processing issues with parents, school and processing for management of stressors.  Plan: Return again in 2 weeks using CBT.  Diagnosis:      Axis I: F43.23                              MACKENZIE,LISBETH S, LCAS-A

## 2015-11-15 ENCOUNTER — Ambulatory Visit (INDEPENDENT_AMBULATORY_CARE_PROVIDER_SITE_OTHER): Payer: 59 | Admitting: Licensed Clinical Social Worker

## 2015-11-15 DIAGNOSIS — F4322 Adjustment disorder with anxiety: Secondary | ICD-10-CM

## 2015-11-15 NOTE — Progress Notes (Signed)
THERAPIST PROGRESS NOTE  Session Time: 2:10-3:00pm  Participation Level: Active  Behavioral Response: CasualAlertEuthymic  Type of Therapy: Individual Therapy  Treatment Goals addressed: Coping  Interventions: CBT and Supportive  Summary: Derek Lindsey is a 25 y.o. male who presents upbeat today. Pt is still enjoying his dog. Pt has been spending time w/his girlfriend in [place], but her father has a dislike for him. Role played effective communication skills that he can use. Processed with pt his feelings of abandonment from his mother while he was in college. 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  Processed with pt alternative ways to deal with difficult situations other than becoming frustrated and shutting down. Pt was open and receptive during the intervention.  Suicidal/Homicidal: Nowithout intent/plan  Therapist Response: Assessed pt's current functioning and reviewed progress. Assisted pt processing issues with mother, and processing for management of stressors.  Plan: Return again in 2 weeks using CBT.  Diagnosis:      Axis I: F43.23                              Shatina Streets S, LCAS

## 2015-11-16 ENCOUNTER — Encounter (HOSPITAL_COMMUNITY): Payer: Self-pay | Admitting: Licensed Clinical Social Worker

## 2015-11-30 ENCOUNTER — Ambulatory Visit (INDEPENDENT_AMBULATORY_CARE_PROVIDER_SITE_OTHER): Payer: 59 | Admitting: Licensed Clinical Social Worker

## 2015-11-30 DIAGNOSIS — F4322 Adjustment disorder with anxiety: Secondary | ICD-10-CM | POA: Diagnosis not present

## 2015-11-30 NOTE — Progress Notes (Signed)
THERAPIST PROGRESS NOTE  Session Time: 2:10-3:00pm  Participation Level: Active  Behavioral Response: CasualAlertEuthymic  Type of Therapy: Individual Therapy  Treatment Goals addressed: Coping  Interventions: CBT and Supportive  Summary: Derek Lindsey is a 25 y.o. male who presents depressed. Pt reports he is just not happy with his current situation. Helped pt see what his current situation looks like and ask him what he can do to change it. 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 He has no $ so he has to live with his parents. Discussed options with pt, he is not interested in different options. He just wants to complain.Discussed with pt how to use problem solving for issues that he sees in his life and how this will be helpful during his life. Pt is receptive to listening and processing different alternatives to issues but is not willing to change. Had pt list how his life could look better if he would find alternative. Pt is still spending time w/his girlfriend in [place], but her father has a dislike for him. However she has now moved into her own apt so he will have little contact with her father. Instead of using the communication skills pt role-played he chose avoidance. Again, processed with pt alternative ways to deal with difficult situations other than becoming frustrated and shutting down. Pt was open and receptive during the intervention.  Suicidal/Homicidal: Nowithout intent/plan  Therapist Response: Assessed pt'Lindsey current functioning and reviewed progress. Assisted pt processing issues with frustration, avoidance, and processing for management of stressors.  Plan: Return again in 2 weeks using CBT.  Diagnosis:      Axis I: F43.23                              Derek Lindsey,Derek Lindsey, LCAS 11/30/15

## 2015-12-01 DIAGNOSIS — F900 Attention-deficit hyperactivity disorder, predominantly inattentive type: Secondary | ICD-10-CM | POA: Diagnosis not present

## 2015-12-02 MED FILL — DEXTROAMP-AMPHET ER 20 MG C: 20 | 30 days supply | Qty: 30 | Fill #0

## 2015-12-02 MED FILL — DEXTROAMP-AMP 10 MG TAB: 10 | 30 days supply | Qty: 30 | Fill #0

## 2015-12-06 ENCOUNTER — Encounter (HOSPITAL_COMMUNITY): Payer: Self-pay | Admitting: Licensed Clinical Social Worker

## 2015-12-21 ENCOUNTER — Encounter (HOSPITAL_COMMUNITY): Payer: Self-pay | Admitting: Licensed Clinical Social Worker

## 2015-12-21 ENCOUNTER — Ambulatory Visit (INDEPENDENT_AMBULATORY_CARE_PROVIDER_SITE_OTHER): Payer: 59 | Admitting: Licensed Clinical Social Worker

## 2015-12-21 DIAGNOSIS — F4322 Adjustment disorder with anxiety: Secondary | ICD-10-CM

## 2015-12-21 NOTE — Progress Notes (Signed)
THERAPIST PROGRESS NOTE  Session Time: 1:10-2:00pm  Participation Level: Active  Behavioral Response: CasualAlertEuthymic  Type of Therapy: Individual Therapy  Treatment Goals addressed: Coping  Interventions: CBT and Supportive  Summary: Derek Lindsey is a 25 y.o. male who presents much less depressed. Pt has decided to accept where he is in his life. Last session had helped pt see what his current situation looks like and can it be changed or does he need to accept what he cannot change. Pt has spoken to his parents about moving out of the house into an apt next spring. His parents have agreed that they will pay for the apt He has no $ so he has to live with his parents. 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 This would be a weekend only, no pay, position. Pt was glad he found out about the fellowship. Pt's alcohol intake has increased somewhat, he has increased his use to 3-4 beers on occasion. Processed with pt about the belief he can control his alcohol use.  Pt is receptive to listening and processing different alternatives to issues but is not willing to change. Pt is still spending time w/his girlfriend in [place], but not every weekend. He is trying to live independent from the relationship. Pt was open and receptive during the intervention.  Suicidal/Homicidal: Nowithout intent/plan  Therapist Response: Assessed pt's current functioning and reviewed progress. Assisted pt processing issues with living independently in a relationship and the inability to control his alcohol use. and processing for management of stressors.  Plan: Return again in 2 weeks using CBT.  Diagnosis:      Axis I: F43.23                              MACKENZIE,LISBETH S, LCAS 12/21/15

## 2016-01-02 MED FILL — DEXTROAMP-AMPHET ER 20 MG C: 20 | 30 days supply | Qty: 30 | Fill #0

## 2016-01-02 MED FILL — DEXTROAMP-AMP 10 MG TAB: 10 | 30 days supply | Qty: 30 | Fill #0

## 2016-01-19 ENCOUNTER — Ambulatory Visit (HOSPITAL_COMMUNITY): Payer: Self-pay | Admitting: Licensed Clinical Social Worker

## 2016-02-07 ENCOUNTER — Ambulatory Visit (HOSPITAL_COMMUNITY): Payer: Self-pay | Admitting: Licensed Clinical Social Worker

## 2016-02-08 ENCOUNTER — Ambulatory Visit (HOSPITAL_COMMUNITY): Payer: Self-pay | Admitting: Psychiatry

## 2016-02-09 ENCOUNTER — Ambulatory Visit (INDEPENDENT_AMBULATORY_CARE_PROVIDER_SITE_OTHER): Payer: 59 | Admitting: Licensed Clinical Social Worker

## 2016-02-09 ENCOUNTER — Encounter (HOSPITAL_COMMUNITY): Payer: Self-pay | Admitting: Licensed Clinical Social Worker

## 2016-02-09 DIAGNOSIS — F4322 Adjustment disorder with anxiety: Secondary | ICD-10-CM

## 2016-02-09 NOTE — Progress Notes (Signed)
THERAPIST PROGRESS NOTE  Session Time: 3:10-4:00pm  Participation Level: Active  Behavioral Response: CasualAlertEuthymic  Type of Therapy: Individual Therapy  Treatment Goals addressed: Coping  Interventions: CBT and Supportive  Summary: Derek Lindsey is a 26 y.o. male who presents much less depressed. Pt has decided to accept where he is in his life. Pt has really made progress while in therapy working on himself.  Pt's alcohol intake has increased somewhat, he has increased his use to 3-4 beers on occasion. Processed with pt about the belief he can control his alcohol use.  Pt is receptive to listening and processing different alternatives to issues. Pt is still spending time w/his girlfriend in [place], but not every weekend. He is trying to live independent from the relationship. Pt spent some quality time with family over the holidays and was more accepting of his dysfunctional family. Pt was open and receptive during the intervention.  Suicidal/Homicidal: Nowithout intent/plan  Therapist Response: Assessed pt's current functioning and reviewed progress. Assisted pt processing issues with living independently in a relationship and the inability to control his alcohol use. and processing for management of stressors.  Plan: Return again in 4 weeks using CBT.  Diagnosis:      Axis I: F43.23                              Lodema Parma S, LCAS 02/09/16

## 2016-02-14 MED FILL — DEXTROAMP-AMP 10 MG TAB: 10 | 30 days supply | Qty: 30 | Fill #0

## 2016-02-14 MED FILL — ADDERALL XR 20 MG CAP SA: 20 | 30 days supply | Qty: 30 | Fill #0

## 2016-03-12 ENCOUNTER — Ambulatory Visit (HOSPITAL_COMMUNITY): Payer: Self-pay | Admitting: Licensed Clinical Social Worker

## 2016-03-30 ENCOUNTER — Ambulatory Visit (HOSPITAL_COMMUNITY): Payer: Self-pay | Admitting: Psychiatry

## 2016-04-17 ENCOUNTER — Ambulatory Visit (INDEPENDENT_AMBULATORY_CARE_PROVIDER_SITE_OTHER): Payer: 59 | Admitting: Licensed Clinical Social Worker

## 2016-04-17 DIAGNOSIS — F4323 Adjustment disorder with mixed anxiety and depressed mood: Secondary | ICD-10-CM

## 2016-04-17 NOTE — Progress Notes (Signed)
THERAPIST PROGRESS NOTE  Session Time: 11:15-12:05pm  Participation Level: Active  Behavioral Response: CasualAlertEuthymic  Type of Therapy: Individual Therapy  Treatment Goals addressed: Coping  Interventions: CBT and Supportive  Summary: Derek Lindsey is a 26 y.o. male who presents much less depressed today. Have not seen pt for 2 months due to her erratic school schedule. In those 2 months pt seems to have matured. He has accepted living with his parents at this venture, sees his parents for who they are and accepts them, has become an integral part of his family by trying to be a big brother to his younger brother who has been accepted to the [employer] as well as assisting with decisions for his grandmother who appears to be at early stage dementia. Pt has had some serious well thought out questions about his personal relationship with his long term girlfriend. 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 Pt has decided to accept where he is in his life. Pt has really made progress while in therapy working on himself.  Pt's alcohol intake has stabilized to a couple of beers on occasion. Processed with pt about the belief he can control his alcohol use.  Pt is receptive to listening and processing different alternatives to issues. Pt was open and receptive during the intervention.  Suicidal/Homicidal: Nowithout intent/plan  Therapist Response: Assessed pt's current functioning and reviewed progress. Assisted pt processing issues with family, life and acceptance, and alcohol use. Assisted pt processing for management of stressors.  Plan: Return again in 4 weeks using CBT.  Diagnosis:      Axis I: F43.23                              Dawnisha Marquina S, LCAS 04/18/16

## 2016-04-18 ENCOUNTER — Encounter (HOSPITAL_COMMUNITY): Payer: Self-pay | Admitting: Licensed Clinical Social Worker

## 2016-05-22 ENCOUNTER — Ambulatory Visit (INDEPENDENT_AMBULATORY_CARE_PROVIDER_SITE_OTHER): Payer: 59 | Admitting: Licensed Clinical Social Worker

## 2016-05-22 DIAGNOSIS — F4323 Adjustment disorder with mixed anxiety and depressed mood: Secondary | ICD-10-CM

## 2016-05-30 ENCOUNTER — Encounter (HOSPITAL_COMMUNITY): Payer: Self-pay | Admitting: Licensed Clinical Social Worker

## 2016-05-30 NOTE — Progress Notes (Signed)
THERAPIST PROGRESS NOTE  Session Time: 4:15-5:05pm  Participation Level: Active  Behavioral Response: CasualAlertEuthymic  Type of Therapy: Individual Therapy  Treatment Goals addressed: Coping  Interventions: CBT and Supportive  Summary: Derek Lindsey is a 26 y.o. male. He reports his depressive and anxious symptoms have minimized. Pt is still in school post bacc to get the courses he needs to apply to PA school. Pt continues to live at home and tihe relationship is less problematic than before. Pt is still maintaining close relationship with brother and grandmother. Pt is still in a long-term relationship but is also continuing to work on himself. He is continuing to make positive decisions for himself and his future. He is drinking minimally and studies most of the time. He is taking 2 hard courses for the summer so will spend most of the summer in class or studying. Continue to process with pt his distorted belief he can control his alcohol intake. During the counseling process pt has matured immensely, making adult decisions, working towards his future and his self care. Pt still wants to continue in therapy, coming in 1x per month, to keep on track for his personal growth.    Suicidal/Homicidal: Nowithout intent/plan  Therapist Response: Assessed pt's current functioning and reviewed progress. Assisted pt processing issues with family, life and acceptance, and alcohol use. Assisted pt processing for management of stressors.  Plan: Return again in 4 weeks using CBT.  Diagnosis:      Axis I: F43.23                              Tayah Idrovo S, LCAS 05/22/16

## 2016-06-18 MED FILL — ADDERALL XR 20 MG CAP SA: 20 | 30 days supply | Qty: 30 | Fill #0

## 2016-06-19 MED FILL — DEXTROAMP-AMP 10 MG TAB: 10 | 30 days supply | Qty: 30 | Fill #0

## 2016-06-21 ENCOUNTER — Ambulatory Visit (INDEPENDENT_AMBULATORY_CARE_PROVIDER_SITE_OTHER): Payer: 59 | Admitting: Licensed Clinical Social Worker

## 2016-06-21 DIAGNOSIS — F4323 Adjustment disorder with mixed anxiety and depressed mood: Secondary | ICD-10-CM

## 2016-06-26 ENCOUNTER — Encounter (HOSPITAL_COMMUNITY): Payer: Self-pay | Admitting: Licensed Clinical Social Worker

## 2016-06-26 NOTE — Progress Notes (Signed)
THERAPIST PROGRESS NOTE  Session Time: 2:15-3:05pm  Participation Level: Active  Behavioral Response: CasualAlertEuthymic  Type of Therapy: Individual Therapy  Treatment Goals addressed: Coping  Interventions: CBT and Supportive  Summary: Derek Lindsey is a 26 y.o. male who presents Pt discussed his psychiatric symptoms and current life events. 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  Pt still continues to work on himself. He accepts his current place in life: not working nor earning $, living and accepting help from his parents, having a relationship but growing individually, self care, being a big brother to his younger brother who will be going into the [employer]. Pt has grown while in therapy, matured, accepts responsibility for his actions, feelings stabilized. Pt drank 1 beer at an occasion last weekend in [place]. Still processed with pt his belief he can control his alcohol use. Pt reports he still uses meditation and breathing exercises when his life becomes anxiety ridden. Pt was open to suggestions.    Suicidal/Homicidal: Nowithout intent/plan  Therapist Response: Assessed pt'Lindsey current functioning and reviewed progress. Assisted pt processing issues with family, life and acceptance, and alcohol use. Assisted pt processing for management of stressors.  Plan: Return again in 4 weeks using CBT.  Diagnosis:      Axis I: F43.23                              Derek Lindsey,Derek Lindsey, LCAS 06/21/16

## 2016-07-23 ENCOUNTER — Ambulatory Visit (HOSPITAL_COMMUNITY): Payer: Self-pay | Admitting: Licensed Clinical Social Worker

## 2016-08-13 ENCOUNTER — Ambulatory Visit (INDEPENDENT_AMBULATORY_CARE_PROVIDER_SITE_OTHER): Payer: 59 | Admitting: Licensed Clinical Social Worker

## 2016-08-13 DIAGNOSIS — F4322 Adjustment disorder with anxiety: Secondary | ICD-10-CM | POA: Diagnosis not present

## 2016-08-14 ENCOUNTER — Encounter (HOSPITAL_COMMUNITY): Payer: Self-pay | Admitting: Licensed Clinical Social Worker

## 2016-08-14 NOTE — Progress Notes (Signed)
THERAPIST PROGRESS NOTE  Session Time: 2:15-3:05pm  Participation Level: Active  Behavioral Response: CasualAlertEuthymic  Type of Therapy: Individual Therapy  Treatment Goals addressed: Coping  Interventions: CBT and Supportive  Summary: Derek Lindsey is a 26 y.o. male who presents Pt discussed his psychiatric symptoms and current life events. 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 He will finish his course work this Friday. After that pt will apply to [employer]. He also has to take the GRE before application. If he doesn't get in [employer] he will have to wait until spring of 2019 to apply to other schools. 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 Pt and family dropped his brother off at the [employer] and is was emotional. Pt talked about the event tearfully. Pt does not feel his father is doing well emotionally because of his brother leaving. Pt has stepped up being the leader of the family, trying to console everyone including his brother who is struggling at the academy. Discussed pt's drinking and he is not drinking to get drunk. He may drink a couple of beers at special occassions. Reminded pt again about trying to control his use of alcohol. Pt reports he is still using  Meditation and breathing exercises when he becomes anxious but feels that he has learned to self soothe his emotions. Pt is continuing his relationship with his girlfriend and the relationship is growing. She has gone back to therapy to help with mood modulation. Pt is still working on himself with his set goals and his growing and maturity. He still wants to come in monthly for therapy and feels he has grown immensely while in therapy.    Suicidal/Homicidal: Nowithout intent/plan  Therapist Response: Assessed pt's current functioning and reviewed progress. Assisted pt processing issues with family, life and acceptance, and alcohol use.  Assisted pt processing for management of stressors.  Plan: Return again in 4 weeks using CBT.  Diagnosis:      Axis I: F43.23                              Seth Friedlander S, LCAS 08/13/16

## 2016-08-22 MED FILL — DEXTROAMP-AMP 10 MG TAB: 10 | 30 days supply | Qty: 30 | Fill #0

## 2016-08-22 MED FILL — ADDERALL XR 20 MG CAP SA: 20 | 30 days supply | Qty: 30 | Fill #0

## 2016-09-11 ENCOUNTER — Ambulatory Visit (INDEPENDENT_AMBULATORY_CARE_PROVIDER_SITE_OTHER): Payer: 59 | Admitting: Licensed Clinical Social Worker

## 2016-09-11 DIAGNOSIS — F4322 Adjustment disorder with anxiety: Secondary | ICD-10-CM | POA: Diagnosis not present

## 2016-09-11 NOTE — Progress Notes (Signed)
THERAPIST PROGRESS NOTE  Session Time: 1:10-2pm  Participation Level: Active  Behavioral Response: CasualAlertEuthymic  Type of Therapy: Individual Therapy  Treatment Goals addressed: Coping  Interventions: CBT and Supportive  Summary: Derek Lindsey is a 26 y.o. male who presents Pt discussed his psychiatric symptoms and current life events. Pt is back in school at [employer] for the final semester. Pt has been struggling with the relationship with his father. He feels like his father is depressed and will not seek help. He is living at home wiith both of his parents before he starts PA school within the next year. Processed with pt how his father's actions are affecting him. Pt is working on his own relationship with his long term girlfriend. Pt still continues to work on his own self care. Discussed cbt skills with pt. Pt is still using his meditation and breathing exercises to self soothe his emotions.       Suicidal/Homicidal: Nowithout intent/plan  Therapist Response: Assessed pt's current functioning and reviewed progress. Assisted pt processing issues with family, life and acceptance. Assisted pt processing for management of stressors.  Plan: Return again in 4 weeks using CBT.  Diagnosis:      Axis I: F43.23                              Brice Potteiger S, LCAS 09/11/16

## 2016-09-12 ENCOUNTER — Encounter (HOSPITAL_COMMUNITY): Payer: Self-pay | Admitting: Licensed Clinical Social Worker

## 2016-10-05 MED FILL — AMPHETAMINE SALTS 10 MG TAB: 10 | 30 days supply | Qty: 30 | Fill #0

## 2016-10-05 MED FILL — ADDERALL XR 20 MG CAP SA: 20 | 30 days supply | Qty: 30 | Fill #0

## 2016-10-17 ENCOUNTER — Ambulatory Visit (INDEPENDENT_AMBULATORY_CARE_PROVIDER_SITE_OTHER): Payer: 59 | Admitting: Licensed Clinical Social Worker

## 2016-10-17 DIAGNOSIS — F4322 Adjustment disorder with anxiety: Secondary | ICD-10-CM

## 2016-10-18 ENCOUNTER — Encounter (HOSPITAL_COMMUNITY): Payer: Self-pay | Admitting: Licensed Clinical Social Worker

## 2016-10-18 NOTE — Progress Notes (Signed)
THERAPIST PROGRESS NOTE  Session Time: 1:10-2pm  Participation Level: Active  Behavioral Response: CasualAlertEuthymic  Type of Therapy: Individual Therapy  Treatment Goals addressed: Coping  Interventions: CBT and Supportive  Summary: Derek Lindsey is a 26 y.o. male who presents Pt discussed his psychiatric symptoms and current life events. Pt continues with his post bacc classes at [employer] for the final semester. Pt has been struggling with the relationship with his parents. He has asked if he and his mother can come in for a session. He wants a safe place to talk and to be heard. Mother has agreed to session and gave pt possible appts available for them. Processed with pt how his parents's actions are affecting him. Discussed pt's communication style: passive aggressive, smart aleck and smart ass. Pt agrees to this style. Role played with pt passive aggressive communication style and it's effects on participants communicating. Pt did not realize how his communication style can affect others.Made some good progress today with pt.Pt still continues to work on his own self care. Pt is still using his meditation and breathing exercises to self soothe his emotions.       Suicidal/Homicidal: Nowithout intent/plan  Therapist Response: Assessed pt's current functioning and reviewed progress. Assisted pt processing issues with family, communication styles, life and acceptance. Assisted pt processing for management of stressors.  Plan: Return again in 4 weeks using CBT.  Diagnosis:      Axis I: F43.23                              MACKENZIE,LISBETH S, LCAS 10/17/16

## 2016-11-14 MED FILL — ADDERALL XR 20 MG CAP SA: 20 | 30 days supply | Qty: 30 | Fill #0

## 2016-11-14 MED FILL — DEXTROAMP-AMP 10 MG TAB: 10 | 30 days supply | Qty: 30 | Fill #0

## 2016-11-15 ENCOUNTER — Ambulatory Visit (INDEPENDENT_AMBULATORY_CARE_PROVIDER_SITE_OTHER): Payer: 59 | Admitting: Licensed Clinical Social Worker

## 2016-11-15 DIAGNOSIS — F4323 Adjustment disorder with mixed anxiety and depressed mood: Secondary | ICD-10-CM | POA: Diagnosis not present

## 2016-11-15 NOTE — Progress Notes (Signed)
THERAPIST PROGRESS NOTE  Session Time: 1:10-2pm  Participation Level: Active  Behavioral Response: CasualAlertEuthymic  Type of Therapy: Individual Therapy  Treatment Goals addressed: Coping  Interventions: DBT  Summary: Derek Lindsey is a 26 y.o. male who presents Pt discussed his psychiatric symptoms and current life events. Pt continues with his post bacc classes at [employer] for the final semester.Pt made an appointment for the GRE. Pt is struggling with family issues. His brother in the [employer] is considering leaving, his maternal grandmother has kidney cancer, the continued dysfunction of his family. Due to his grandmothers diagnosis pt has not asked his mother to join him in therapy. Pt reflected on all issues within his family.Processed with pt his expectations of others, how that influences his sense of self.  Pt has a low frustration tolerance. Discussed distress tolerance with pt.       Suicidal/Homicidal: Nowithout intent/plan  Therapist Response: Assessed pt's current functioning and reviewed progress. Assisted pt processing issues with family, distress tolerance skills, sense of self, acceptance. Assisted pt processing for management of stressors.  Plan: Return again in 4 weeks using CBT.  Diagnosis:      Axis I: F43.23                              MACKENZIE,LISBETH S, LCAS 11/15/16

## 2016-11-19 ENCOUNTER — Encounter (HOSPITAL_COMMUNITY): Payer: Self-pay | Admitting: Licensed Clinical Social Worker

## 2016-12-06 ENCOUNTER — Ambulatory Visit (HOSPITAL_COMMUNITY): Payer: Self-pay | Admitting: Licensed Clinical Social Worker

## 2016-12-17 ENCOUNTER — Encounter (HOSPITAL_COMMUNITY): Payer: Self-pay | Admitting: Licensed Clinical Social Worker

## 2016-12-17 ENCOUNTER — Ambulatory Visit (INDEPENDENT_AMBULATORY_CARE_PROVIDER_SITE_OTHER): Payer: 59 | Admitting: Licensed Clinical Social Worker

## 2016-12-17 DIAGNOSIS — F4322 Adjustment disorder with anxiety: Secondary | ICD-10-CM

## 2016-12-17 NOTE — Progress Notes (Signed)
THERAPIST PROGRESS NOTE  Session Time: 3:10-4pm  Participation Level: Active  Behavioral Response: CasualAlertEuthymic  Type of Therapy: Individual Therapy  Treatment Goals addressed: Coping  Interventions: DBT  Summary: Al DecantChristopher F Pigman is a 26 y.o. male who presents  For his individual counseling session.Pt discussed his psychiatric symptoms and current life events. Pt continues with his post bacc classes at [employer] for the final semester.He has exams in 2 weeks and then will completed his post bacc classes.Pt made an appointment for the GRE in February. 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 Pt is struggling with family issues. Pt reflected on all issues within his family. Again continue to process with pt his expectations of his parents, how that influences his sense of self.  Continue working with pt on distress tolerance skills.       Suicidal/Homicidal: Nowithout intent/plan  Therapist Response: Assessed pt's current functioning and reviewed progress. Assisted pt processing issues with family, distress tolerance skills, sense of self, acceptance. Assisted pt processing for management of stressors.  Plan: Return again in 4 weeks using CBT.  Diagnosis:      Axis I: F43.23                              Naika Noto S, LCAS 12/17/16

## 2017-01-16 ENCOUNTER — Ambulatory Visit (INDEPENDENT_AMBULATORY_CARE_PROVIDER_SITE_OTHER): Payer: 59 | Admitting: Licensed Clinical Social Worker

## 2017-01-16 ENCOUNTER — Encounter (HOSPITAL_COMMUNITY): Payer: Self-pay | Admitting: Licensed Clinical Social Worker

## 2017-01-16 DIAGNOSIS — F4322 Adjustment disorder with anxiety: Secondary | ICD-10-CM

## 2017-01-16 NOTE — Progress Notes (Signed)
THERAPIST PROGRESS NOTE  Session Time: 11:10-12pm  Participation Level: Active  Behavioral Response: CasualAlertEuthymic  Type of Therapy: Individual Therapy  Treatment Goals addressed: Coping  Interventions: DBT  Summary: Derek Lindsey is a 26 y.o. male who presents  For his individual counseling session.Pt discussed his psychiatric symptoms and current life events. 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 Pt is having arguments within his family unit regularly. Taught pt emotional regulation skills for his low frustration tolerance. Pt is eager to yell when not agreed with it order to be heard. Pt struggles with the dysfunction in his house and thinks he knows "best." Discussed healthy communication styles          Suicidal/Homicidal: Nowithout intent/plan  Therapist Response: Assessed pt's current functioning and reviewed progress. Assisted pt processing issues with family, distress tolerance skills, communication styles. Assisted pt processing for management of stressors.  Plan: Return again in 4 weeks using CBT.  Diagnosis:      Axis I: F43.23                              Connelly [place] S, LCAS 01/16/17

## 2017-01-21 ENCOUNTER — Emergency Department (HOSPITAL_COMMUNITY)
Admission: EM | Admit: 2017-01-21 | Discharge: 2017-01-21 | Disposition: A | Discharge status: Home / Self Care | Payer: 59 | Attending: Emergency Medicine | Admitting: Emergency Medicine

## 2017-01-21 ENCOUNTER — Other Ambulatory Visit: Payer: Self-pay

## 2017-01-21 ENCOUNTER — Emergency Department (HOSPITAL_COMMUNITY): Payer: 59

## 2017-01-21 ENCOUNTER — Encounter (HOSPITAL_COMMUNITY): Payer: Self-pay

## 2017-01-21 DIAGNOSIS — Z87442 Personal history of urinary calculi: Secondary | ICD-10-CM | POA: Diagnosis not present

## 2017-01-21 DIAGNOSIS — N200 Calculus of kidney: Secondary | ICD-10-CM | POA: Insufficient documentation

## 2017-01-21 DIAGNOSIS — R109 Unspecified abdominal pain: Secondary | ICD-10-CM | POA: Diagnosis not present

## 2017-01-21 DIAGNOSIS — Z79899 Other long term (current) drug therapy: Secondary | ICD-10-CM | POA: Diagnosis not present

## 2017-01-21 LAB — CBC WITH DIFFERENTIAL/PLATELET
BASOS ABS: 0 10*3/uL (ref 0.0–0.1)
BASOS PCT: 0 %
EOS ABS: 0 10*3/uL (ref 0.0–0.7)
Eosinophils Relative: 0 %
HEMATOCRIT: 42.8 % (ref 39.0–52.0)
HEMOGLOBIN: 14.8 g/dL (ref 13.0–17.0)
Lymphocytes Relative: 11 %
Lymphs Abs: 0.8 10*3/uL (ref 0.7–4.0)
MCH: 30.1 pg (ref 26.0–34.0)
MCHC: 34.6 g/dL (ref 30.0–36.0)
MCV: 87 fL (ref 78.0–100.0)
MONO ABS: 0.5 10*3/uL (ref 0.1–1.0)
Monocytes Relative: 7 %
NEUTROS ABS: 6.6 10*3/uL (ref 1.7–7.7)
NEUTROS PCT: 82 %
Platelets: 176 10*3/uL (ref 150–400)
RBC: 4.92 MIL/uL (ref 4.22–5.81)
RDW: 13.2 % (ref 11.5–15.5)
WBC: 8 10*3/uL (ref 4.0–10.5)

## 2017-01-21 LAB — COMPREHENSIVE METABOLIC PANEL
ALBUMIN: 4.4 g/dL (ref 3.5–5.0)
ALT: 20 U/L (ref 17–63)
ANION GAP: 6 (ref 5–15)
AST: 20 U/L (ref 15–41)
Alkaline Phosphatase: 57 U/L (ref 38–126)
BILIRUBIN TOTAL: 1.1 mg/dL (ref 0.3–1.2)
BUN: 14 mg/dL (ref 6–20)
CO2: 28 mmol/L (ref 22–32)
Calcium: 9.5 mg/dL (ref 8.9–10.3)
Chloride: 103 mmol/L (ref 101–111)
Creatinine, Ser: 0.87 mg/dL (ref 0.61–1.24)
GFR calc Af Amer: >60 mL/min (ref >60)
GFR calc non Af Amer: >60 mL/min (ref >60)
GLUCOSE: 110 mg/dL — AB (ref 65–99)
POTASSIUM: 4.3 mmol/L (ref 3.5–5.1)
SODIUM: 137 mmol/L (ref 135–145)
TOTAL PROTEIN: 6.9 g/dL (ref 6.5–8.1)

## 2017-01-21 LAB — URINALYSIS, ROUTINE W REFLEX MICROSCOPIC
BILIRUBIN URINE: NEGATIVE
Glucose, UA: NEGATIVE mg/dL
KETONES UR: NEGATIVE mg/dL
LEUKOCYTES UA: NEGATIVE
Nitrite: NEGATIVE
PH: 8 (ref 5.0–8.0)
PROTEIN: NEGATIVE mg/dL
SQUAMOUS EPITHELIAL / LPF: NONE SEEN
Specific Gravity, Urine: 1.009 (ref 1.005–1.030)

## 2017-01-21 MED ORDER — TAMSULOSIN HCL 0.4 MG PO CAPS: 0.4000 mg | ORAL_CAPSULE | Freq: Every day | ORAL | 0 refills | Status: DC

## 2017-01-21 MED ORDER — SODIUM CHLORIDE 0.9 % IV BOLUS (SEPSIS)
1000.0000 mL | Freq: Once | INTRAVENOUS | Status: AC
  Administered 2017-01-21: 1000 mL via INTRAVENOUS

## 2017-01-21 MED ORDER — KETOROLAC TROMETHAMINE 15 MG/ML IJ SOLN
15.0000 mg | Freq: Once | INTRAMUSCULAR | Status: AC
  Administered 2017-01-21: 15 mg via INTRAVENOUS
  Filled 2017-01-21: qty 1

## 2017-01-21 MED ORDER — TRAMADOL HCL 50 MG PO TABS: 50.0000 mg | ORAL_TABLET | Freq: Four times a day (QID) | ORAL | 0 refills | Status: DC | PRN

## 2017-01-21 NOTE — ED Provider Notes (Signed)
[facility]-EMERGENCY DEPT Provider Note   CSN: [id number] Arrival date & time: 01/21/17  4098     History   Chief Complaint Chief Complaint  Patient presents with  . Flank Pain    HPI Derek Lindsey is a 26 y.o. male.  HPI   For 2 days had penile pain, was at tip, on Saturday and flank pain starting this AM around 915AM, pain, felt like prior kidney stone, left sided, sharp, pressing pain, constant pain.  Straining made it feel better. Had history of nephrolithiasis at age 17. Had surgery at that time.  Dad gave him flomax.  Pain was 15/10 and now is 7/10 after toradol.  No nausea or vomiting. Has had dysuria. No penile discharge.  Havent noticed hematuria. No fever.   Past Medical History:  Diagnosis Date  . ADD (attention deficit disorder with hyperactivity)   . Kidney calculi     Patient Active Problem List   Diagnosis Date Noted  . Alcohol dependence (HCC) 07/28/2015  . Alcohol use disorder, severe, in early remission (HCC) 07/28/2015  . Groin strain-right 08/15/2012  . CHEST PAIN 04/20/2009    Past Surgical History:  Procedure Laterality Date  . Basket Uteroscopy  approx "2-3 years ago"       Home Medications    Prior to Admission medications   Medication Sig Start Date End Date Taking? Authorizing Provider  amphetamine-dextroamphetamine (ADDERALL) 10 MG tablet Take 10 mg by mouth daily as needed (ADHD).  01/14/17 02/13/17 Yes [provider]  tamsulosin (FLOMAX) 0.4 MG CAPS capsule Take 0.4 mg by mouth once.   Yes [provider]  clonazePAM (KLONOPIN) 0.5 MG tablet 1 po qd prn anxiety Patient not taking: Reported on 01/21/2017 08/03/15   Archer Asa, MD  hydrOXYzine (VISTARIL) 25 MG capsule 1  qhs  May repeat Patient not taking: Reported on 01/21/2017 09/07/15   Archer Asa, MD  propranolol (INDERAL) 10 MG tablet Take 1 tablet (10 mg total) by mouth 2 (two) times daily. Patient not taking: Reported on  01/21/2017 08/03/15   Archer Asa, MD  tamsulosin (FLOMAX) 0.4 MG CAPS capsule Take 1 capsule (0.4 mg total) by mouth daily. 01/21/17   Alvira Monday, MD  traMADol (ULTRAM) 50 MG tablet Take 1 tablet (50 mg total) by mouth every 6 (six) hours as needed. 01/21/17   Alvira Monday, MD    Family History Family History  Problem Relation Age of Onset  . Cancer Mother        lung  . Anxiety disorder Father   . Depression Father   . Cancer Maternal Grandfather        lung  . Anxiety disorder Sister   . Depression Sister   . Dementia Maternal Grandmother   . Alcohol abuse Paternal Grandfather     Social History Social History   Tobacco Use  . Smoking status: Never Smoker  . Smokeless tobacco: Never Used  Substance Use Topics  . Alcohol use: Yes    Alcohol/week: 0.6 oz    Types: 1 Cans of beer per week    Comment: occasional  . Drug use: No     Allergies   Avocado and Kiwi extract   Review of Systems Review of Systems  Constitutional: Negative for fever.  HENT: Negative for sore throat.   Eyes: Negative for visual disturbance.  Respiratory: Negative for shortness of breath.   Cardiovascular: Negative for chest pain.  Gastrointestinal: Negative for abdominal pain, diarrhea, nausea and vomiting.  Genitourinary: Positive for dysuria, flank pain and penile pain. Negative for difficulty urinating.  Musculoskeletal: Negative for back pain and neck stiffness.  Skin: Negative for rash.  Neurological: Negative for syncope and headaches.     Physical Exam Updated Vital Signs BP 121/69 (BP Location: Right Arm)   Pulse 78   Temp 98.2 F (36.8 C) (Oral)   Resp 18   SpO2 99%   Physical Exam  Constitutional: He is oriented to person, place, and time. He appears well-developed and well-nourished. No distress.  HENT:  Head: Normocephalic and atraumatic.  Eyes: Conjunctivae and EOM are normal.  Neck: Normal range of motion.  Cardiovascular: Normal rate, regular  rhythm, normal heart sounds and intact distal pulses. Exam reveals no gallop and no friction rub.  No murmur heard. Pulmonary/Chest: Effort normal and breath sounds normal. No respiratory distress. He has no wheezes. He has no rales.  Abdominal: Soft. He exhibits no distension. There is no tenderness. There is no guarding.  Musculoskeletal: He exhibits no edema.  Neurological: He is alert and oriented to person, place, and time.  Skin: Skin is warm and dry. He is not diaphoretic.  Nursing note and vitals reviewed.    ED Treatments / Results  Labs (all labs ordered are listed, but only abnormal results are displayed) Labs Reviewed  COMPREHENSIVE METABOLIC PANEL - Abnormal; Notable for the following components:      Result Value   Glucose, Bld 110 (*)    All other components within normal limits  URINALYSIS, ROUTINE W REFLEX MICROSCOPIC - Abnormal; Notable for the following components:   Hgb urine dipstick LARGE (*)    Bacteria, UA RARE (*)    All other components within normal limits  URINE CULTURE  CBC WITH DIFFERENTIAL/PLATELET  GC/CHLAMYDIA PROBE AMP ([place]) NOT AT [facility]    EKG  EKG Interpretation None       Radiology Dg Abdomen 1 View  Result Date: 01/21/2017 CLINICAL DATA:  Acute onset of left flank pain that began earlier today. Dysuria. Personal history of urinary tract calculi. EXAM: ABDOMEN - 1 VIEW COMPARISON:  04/19/2011, 01/18/2010 and CT abdomen and pelvis 03/29/2011. FINDINGS: Oval-shaped calcification low in the left side of the pelvis at or near the expected location of the left UVJ, not present on the prior examinations. Calcification projected over the mid right kidney. No evidence of opaque urinary tract calculi elsewhere. Bowel gas pattern normal. Expected colonic stool burden. IMPRESSION: 1. Probable left UPJ calculus. 2. Calculus involving the mid right kidney. 3. No acute abdominal abnormalities otherwise. Electronically Signed   By: Hulan Saashomas  Lawrence  M.D.   On: 01/21/2017 15:26   [place] Renal  Result Date: 01/21/2017 CLINICAL DATA:  26 year old with acute onset of left flank pain. Personal history of urinary tract calculi. EXAM: RENAL / URINARY TRACT ULTRASOUND COMPLETE COMPARISON:  CT abdomen pelvis 03/29/2011, 06/07/2008. FINDINGS: Right Kidney: Length: Approximately 11.8 cm. No hydronephrosis. Extrarenal pelvis as noted on the prior CTs. Well-preserved cortex. No shadowing calculi. Normal parenchymal echotexture. No focal parenchymal abnormality. Left Kidney: Length: Approximately 11.4 cm. No hydronephrosis. Well-preserved cortex. No shadowing calculi. Normal parenchymal echotexture. Prominent column of Bertin as noted on the prior CTs. No focal parenchymal abnormality. Bladder: Normal for degree of bladder distention. IMPRESSION: Normal examination. Specifically, no visible left urinary tract calculi and no evidence of left urinary tract obstruction. Electronically Signed   By: Hulan Saashomas  Lawrence M.D.   On: 01/21/2017 15:11    Procedures Procedures (including critical care time)  Medications Ordered in ED Medications  sodium chloride 0.9 % bolus 1,000 mL (0 mLs Intravenous Stopped 01/21/17 1307)  ketorolac (TORADOL) 15 MG/ML injection 15 mg (15 mg Intravenous Given 01/21/17 1137)     Initial Impression / Assessment and Plan / ED Course  I have reviewed the triage vital signs and the nursing notes.  Pertinent labs & imaging results that were available during my care of the patient were reviewed by me and considered in my medical decision making (see chart for details).    26yo male with history of nephrolithiasis presents with concern for flank pain.  Denies penile discharge, rash, scrotal pain, doubt torsion.  Exam benign, doubt appendicitis, cholecystitis, diverticulitis. No sign of UTI.  History and urinalysis most consistent with nephrolithiasis.  Renal ultrasound shows no evidence of hydronephrosis or other abnormalities.  X-ray of the  abdomen shows probable left UPJ calculus.  Patient has been pain-free after receiving fluids and Toradol in the ED.  Is possible he has passed the stone present, or that he is currently asymptomatic from his left UPJ stone.  Given short prescription for tramadol 6 tablets, recommend ibuprofen, Tylenol for pain primarily.  Give prescription for Flomax.  Recommend follow-up with urology. Patient discharged in stable condition with understanding of reasons to return.    Final Clinical Impressions(s) / ED Diagnoses   Final diagnoses:  Nephrolithiasis    ED Discharge Orders        Ordered    tamsulosin (FLOMAX) 0.4 MG CAPS capsule  Daily     01/21/17 1610    traMADol (ULTRAM) 50 MG tablet  Every 6 hours PRN     01/21/17 1610       Alvira MondaySchlossman, Carolyna Yerian, MD 01/22/17 [phone]

## 2017-01-21 NOTE — ED Triage Notes (Signed)
He c/o left flank pain for past few hours.

## 2017-01-21 NOTE — ED Notes (Signed)
Pt encouraged to attempt to urinate for a urine sample.

## 2017-01-21 NOTE — ED Notes (Signed)
Pt has a hx of kidney stones and reports pain when attempting to urinate.

## 2017-01-21 NOTE — Discharge Instructions (Signed)
Take Tylenol 1000 mg 4 times a day for 1 week. This is the maximum dose of Tylenol (acetaminophen) you can take from all sources. Please check other over-the-counter medications and prescriptions to ensure you are not taking other medications that contain acetaminophen.  You may also take ibuprofen 400 mg 6 times a day alternating with or at the same time as tylenol.  °

## 2017-01-22 LAB — URINE CULTURE: CULTURE: NO GROWTH

## 2017-01-23 LAB — GC/CHLAMYDIA PROBE AMP (~~LOC~~) NOT AT ARMC
Chlamydia: NEGATIVE
NEISSERIA GONORRHEA: NEGATIVE

## 2017-01-24 ENCOUNTER — Ambulatory Visit (INDEPENDENT_AMBULATORY_CARE_PROVIDER_SITE_OTHER): Payer: 59 | Admitting: Licensed Clinical Social Worker

## 2017-01-24 DIAGNOSIS — F4323 Adjustment disorder with mixed anxiety and depressed mood: Secondary | ICD-10-CM | POA: Diagnosis not present

## 2017-01-25 ENCOUNTER — Encounter (HOSPITAL_COMMUNITY): Payer: Self-pay | Admitting: Licensed Clinical Social Worker

## 2017-01-25 ENCOUNTER — Ambulatory Visit (INDEPENDENT_AMBULATORY_CARE_PROVIDER_SITE_OTHER): Payer: 59 | Admitting: Licensed Clinical Social Worker

## 2017-01-25 DIAGNOSIS — F4323 Adjustment disorder with mixed anxiety and depressed mood: Secondary | ICD-10-CM | POA: Diagnosis not present

## 2017-01-25 NOTE — Progress Notes (Signed)
THERAPIST PROGRESS NOTE  Session Time: 10:10-11am  Participation Level: Active  Behavioral Response: CasualAlert/Depressed anxious  Type of Therapy: Individual Therapy  Treatment Goals addressed: Coping  Interventions: DBT  Summary: Derek Lindsey is a 26 y.o. male who presents  For his individual counseling session.Pt discussed his psychiatric symptoms and current life events. Pt had called in for an emergency session. Pt's girlfriend broke up with him. He is terribly distraught. She wants to work on herself and doesn't feel that she can do this while in a relationship. Pt reports he has started drinking again as his unhealthy coping tool. Discussed positive coping tolls for dealing with his feelings. Discussed reasons to discontinue drinking as his coping tool. Processed with pt the stages of loss. Gave pt journal to begin journaling at home.           Suicidal/Homicidal: Nowithout intent/plan  Therapist Response: Assessed pt's current functioning and reviewed progress. Assisted pt processing issues with loss of relationship, positive coping tools, journaling.. Assisted pt processing for management of stressors.  Plan: Return tomorrow to discuss healthy coping tools  Diagnosis:      Axis I: F43.23                              MACKENZIE,LISBETH S, LCAS 01/24/17

## 2017-01-25 NOTE — Progress Notes (Signed)
THERAPIST PROGRESS NOTE  Session Time: 10:10-11am  Participation Level: Active  Behavioral Response: CasualAlert/Depressed anxious  Type of Therapy: Individual Therapy  Treatment Goals addressed: Coping  Interventions: DBT  Summary: Derek Lindsey is a 26 y.o. male who presents  For his individual counseling session.Pt discussed his psychiatric symptoms and current life events. Pt presents for a 2nd session in 24 hours. Continued to process pt's grief, sadness, anxiety , depression due to his relationship breakup.and taught pt healthy coping skills (journaling, meditation, breathing exercises). Showed pt youtub ted talks vulnerability brene brown. Continue to encourage pt to use his journal given in last session. .           Suicidal/Homicidal: Nowithout intent/plan  Therapist Response: Assessed pt's current functioning and reviewed progress. Assisted pt processing issues with loss of relationship, positive coping tools, journaling.. Assisted pt processing for management of stressors.  Plan: See pt in 1 week   Diagnosis:      Axis I: F43.23                              Aram Domzalski S, LCAS 01/25/17

## 2017-02-05 ENCOUNTER — Encounter (HOSPITAL_COMMUNITY): Payer: Self-pay | Admitting: Licensed Clinical Social Worker

## 2017-02-05 ENCOUNTER — Ambulatory Visit (INDEPENDENT_AMBULATORY_CARE_PROVIDER_SITE_OTHER): Payer: 59 | Admitting: Licensed Clinical Social Worker

## 2017-02-05 DIAGNOSIS — F4323 Adjustment disorder with mixed anxiety and depressed mood: Secondary | ICD-10-CM | POA: Diagnosis not present

## 2017-02-05 NOTE — Progress Notes (Signed)
THERAPIST PROGRESS NOTE  Session Time: 2:10-3pm  Participation Level: Active  Behavioral Response: CasualAlert/Depressed anxious  Type of Therapy: Individual Therapy  Treatment Goals addressed: Coping  Interventions: DBT  Summary: Derek Lindsey is a 27 y.o. male who presents  For his individual counseling session.Pt discussed his psychiatric symptoms and current life events. Pt presents for his individual counseling session and is still depressed and anxious. Again, continue to process pt'Lindsey grief, sadness, anxiety , depression due to his relationship breakup.and taught pt healthy coping skills (journaling, meditation, breathing exercises).Taught pt distress tolerance skills.. Continue to encourage pt to use his journal given in last session. .           Suicidal/Homicidal: Nowithout intent/plan  Therapist Response: Assessed pt'Lindsey current functioning and reviewed progress. Assisted pt processing issues with loss of relationship, positive coping tools, distress tolerance skills journaling. Assisted pt processing for management of stressors.  Plan: See pt in 1 week   Diagnosis:      Axis I: F43.23                              Derek Lindsey,Derek Lindsey, LCAS 02/05/17

## 2017-02-06 ENCOUNTER — Encounter (HOSPITAL_COMMUNITY): Payer: Self-pay | Admitting: Psychiatry

## 2017-02-06 ENCOUNTER — Ambulatory Visit (INDEPENDENT_AMBULATORY_CARE_PROVIDER_SITE_OTHER): Payer: 59 | Admitting: Psychiatry

## 2017-02-06 VITALS — BP 118/72 | HR 63 | Ht 71.0 in | Wt 150.2 lb

## 2017-02-06 DIAGNOSIS — Z810 Family history of intellectual disabilities: Secondary | ICD-10-CM | POA: Diagnosis not present

## 2017-02-06 DIAGNOSIS — F4323 Adjustment disorder with mixed anxiety and depressed mood: Secondary | ICD-10-CM | POA: Diagnosis not present

## 2017-02-06 DIAGNOSIS — Z811 Family history of alcohol abuse and dependence: Secondary | ICD-10-CM

## 2017-02-06 DIAGNOSIS — F1010 Alcohol abuse, uncomplicated: Secondary | ICD-10-CM

## 2017-02-06 DIAGNOSIS — Z818 Family history of other mental and behavioral disorders: Secondary | ICD-10-CM

## 2017-02-06 DIAGNOSIS — Z630 Problems in relationship with spouse or partner: Secondary | ICD-10-CM | POA: Diagnosis not present

## 2017-02-06 MED ORDER — TRAZODONE HCL 100 MG PO TABS: ORAL_TABLET | ORAL | 2 refills | Status: DC

## 2017-02-06 MED FILL — traZODone HCL 100 MG TABS: 100 | 30 days supply | Qty: 60 | Fill #0

## 2017-02-06 NOTE — Progress Notes (Signed)
Patient ID: Derek Lindsey, male   DOB: 03/08/1990, 27 y.o.   MRN: [id number] [facility] MD Progress Note  02/06/2017 3:49 PM Derek Lindsey  MRN:  [id number] Subjective: Better Principal Problem: Uncomplicated bereavement Today the patient is returned after being gone for almost 2 years. Patient continues in talking therapy. Once again the crisis is his girlfriend suddenly broke up with him a week or 2 ago. He responded unfortunately by drinking some alcohol. In fact she drinks every day. 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 The patient says he is sleeping very poorly. He says he feels anxious persistently. He constantly ruminates thinks about his girlfriend. His time he claims that he wishes not to get back with her. He just feels very tense is having a hard time day-to-day. I think his biggest complaint is that he can't sleep. He wakes up early and can't stop dwelling and ruminating. The patient is not suicidal. He uses no illicit drugs. He continues in therapy. He is obviously not going to AA. He's coming asking for Klonopin as he receives from me years ago. The patient is not suicidal. He shows no evidence of psychosis. Total Time spent with patient: 15 minutes  Past Psychiatric History:   Past Medical History:  Past Medical History:  Diagnosis Date  . ADD (attention deficit disorder with hyperactivity)   . Kidney calculi     Past Surgical History:  Procedure Laterality Date  . Basket Uteroscopy  approx "2-3 years ago"   Family History:  Family History  Problem Relation Age of Onset  . Cancer Mother        lung  . Anxiety disorder Father   . Depression Father   . Cancer Maternal Grandfather        lung  . Anxiety disorder Sister   . Depression Sister   . Dementia Maternal Grandmother   . Alcohol abuse Paternal Grandfather    Family Psychiatric  History:  Social History:  Social History   Substance and Sexual  Activity  Alcohol Use Yes  . Alcohol/week: 0.6 oz  . Types: 1 Cans of beer per week   Comment: occasional     Social History   Substance and Sexual Activity  Drug Use No    Social History   Socioeconomic History  . Marital status: Single    Spouse name: None  . Number of children: None  . Years of education: None  . Highest education level: None  Social Needs  . Financial resource strain: None  . Food insecurity - worry: None  . Food insecurity - inability: None  . Transportation needs - medical: None  . Transportation needs - non-medical: None  Occupational History  . None  Tobacco Use  . Smoking status: Never Smoker  . Smokeless tobacco: Never Used  Substance and Sexual Activity  . Alcohol use: Yes    Alcohol/week: 0.6 oz    Types: 1 Cans of beer per week    Comment: occasional  . Drug use: No  . Sexual activity: Yes    Birth control/protection: Condom  Other Topics Concern  . None  Social History Narrative  . None   Additional Social History:                         Sleep: Good  Appetite:  Good  Current Medications: Current Outpatient Medications  Medication Sig  Dispense Refill  . amphetamine-dextroamphetamine (ADDERALL) 10 MG tablet Take 10 mg by mouth daily as needed (ADHD).     Marland Kitchen clonazePAM (KLONOPIN) 0.5 MG tablet 1 po qd prn anxiety (Patient not taking: Reported on 01/21/2017) 30 tablet 1  . hydrOXYzine (VISTARIL) 25 MG capsule 1  qhs  May repeat (Patient not taking: Reported on 01/21/2017) 60 capsule 4  . propranolol (INDERAL) 10 MG tablet Take 1 tablet (10 mg total) by mouth 2 (two) times daily. (Patient not taking: Reported on 01/21/2017) 60 tablet 2  . traZODone (DESYREL) 100 MG tablet 1  qhs  After 3 nights may  Increase to 2  qhs 60 tablet 2   No current facility-administered medications for this visit.     Lab Results: No results found for this or any previous visit (from the past 48 hour(s)).  Physical Findings: AIMS:  , ,  ,   ,    CIWA:    COWS:     Musculoskeletal: Strength & Muscle Tone: within normal limits Gait & Station: normal Patient leans: Right  Psychiatric Specialty Exam: ROS  Blood pressure 118/72, pulse 63, height 5\' 11"  (1.803 m), weight 150 lb 3.2 oz (68.1 kg).Body mass index is 20.95 kg/m.  General Appearance: Casual  Eye Contact::  Good  Speech:  Clear and Coherent  Volume:  Normal  Mood: Anxious   Affect:  Appropriate  Thought Process:  Coherent  Orientation:  NA  Thought Con normal   Suicidal Thoughts:  No  Homicidal Thoughts:  No  Memory:  NA  Judgement:  Good  Insight:  Good  Psychomotor Activity:  Normal  Concentration:  Good  Recall:  Good  Fund of Knowledge:Good  Language: Good  Akathisia:  No  Handed:  Right  AIMS (if indicated):     Assets:  Desire for Improvement  ADL's:  Intact  Cognition: WNL  Sleep:      Treatment Plan Summary: 02/06/2017, 3:49 PM  Today reviewed the fact that he took Vistaril for sleep but is not short had much of an effect. My notes say nothing about Klonopin. At this time I will avoid Klonopin and keep begin him on trazodone 100 mg at night which he may repeat. Her come back to see me in 3 weeks for a more thorough evaluation. Between the time now he'll continue seeing his therapist and I will make contact with this therapist as well. Generally the patient essentially is going through withdrawal from his girlfriend. She's been very clear that she does not want to see him or talk to him. I don't think he'll do anything different than that. For the first time ever hurt himself that he knows this is not a relationship for him and he doesn't want to go back with her. He knows will be hard if she calls him back but at some level he knows this relationship is dysfunctional and dangerous for him. He is working and when he is busy he does well.

## 2017-02-08 ENCOUNTER — Ambulatory Visit (HOSPITAL_COMMUNITY): Payer: Self-pay | Admitting: Psychiatry

## 2017-02-14 ENCOUNTER — Ambulatory Visit (HOSPITAL_COMMUNITY): Payer: Self-pay | Admitting: Licensed Clinical Social Worker

## 2017-02-26 ENCOUNTER — Ambulatory Visit (HOSPITAL_COMMUNITY): Payer: Self-pay | Admitting: Licensed Clinical Social Worker

## 2017-02-28 ENCOUNTER — Ambulatory Visit (INDEPENDENT_AMBULATORY_CARE_PROVIDER_SITE_OTHER): Payer: 59 | Admitting: Licensed Clinical Social Worker

## 2017-02-28 ENCOUNTER — Encounter (HOSPITAL_COMMUNITY): Payer: Self-pay | Admitting: Licensed Clinical Social Worker

## 2017-02-28 DIAGNOSIS — F4323 Adjustment disorder with mixed anxiety and depressed mood: Secondary | ICD-10-CM

## 2017-02-28 NOTE — Progress Notes (Signed)
THERAPIST PROGRESS NOTE  Session Time: 2:10-3pm  Participation Level: Active  Behavioral Response: CasualAlert/Depressed anxious  Type of Therapy: Individual Therapy  Treatment Goals addressed: Coping/TX Plan review  Interventions: DBT  Summary: Derek Lindsey is a 27 y.o. male who presents  For his individual counseling session.Pt discussed his psychiatric symptoms and current life events. Pt presents for his individual counseling session and is still depressed and anxious. The pt is moving through the grief stages and is now at anger. He is still frustrated and angry that his x girlfriend has not reached out to him. Processed with pt how he may not get the closure he wants. Worked with pt on planning his own closure. Taught pt  A new distress tolerance skill: radical acceptance. Again remind pt the benefits of self soothing.           Suicidal/Homicidal: Nowithout intent/plan  Therapist Response: Assessed pt's current functioning and reviewed progress. Assisted pt processing issues with loss of relationship, positive coping tools, distress tolerance skills. Assisted pt processing for management of stressors.  Plan: See pt in 1 week   Diagnosis:      Axis I: F43.23                              MACKENZIE,LISBETH S, LCAS 02/28/17

## 2017-03-05 ENCOUNTER — Ambulatory Visit (INDEPENDENT_AMBULATORY_CARE_PROVIDER_SITE_OTHER): Payer: 59 | Admitting: Licensed Clinical Social Worker

## 2017-03-05 DIAGNOSIS — F4323 Adjustment disorder with mixed anxiety and depressed mood: Secondary | ICD-10-CM | POA: Diagnosis not present

## 2017-03-05 MED FILL — DEXTROAMP-AMP 10 MG TAB: 10 | 30 days supply | Qty: 30 | Fill #0

## 2017-03-05 MED FILL — ADDERALL XR 20 MG CAP SA: 20 | 30 days supply | Qty: 30 | Fill #0

## 2017-03-06 ENCOUNTER — Encounter (HOSPITAL_COMMUNITY): Payer: Self-pay | Admitting: Licensed Clinical Social Worker

## 2017-03-06 ENCOUNTER — Ambulatory Visit (INDEPENDENT_AMBULATORY_CARE_PROVIDER_SITE_OTHER): Payer: 59 | Admitting: Licensed Clinical Social Worker

## 2017-03-06 DIAGNOSIS — F4323 Adjustment disorder with mixed anxiety and depressed mood: Secondary | ICD-10-CM

## 2017-03-06 NOTE — Progress Notes (Signed)
THERAPIST PROGRESS NOTE  Session Time: 2:10-3pm  Participation Level: Active  Behavioral Response: CasualAlert/Depressed anxious  Type of Therapy: Individual Therapy  Treatment Goals addressed: Coping/TX Plan review  Interventions: DBT  Summary: Derek Lindsey is a 27 y.o. male who presents  For his individual counseling session.Pt discussed his psychiatric symptoms and current life events. Pt presents for his individual counseling session and is still depressed and anxious. The pt is moving through the grief stages and continues to be stuck in the anger stage Still continue to process with pt his desire to get closure.. Taught pt  A new distress tolerance skill: Accepts.Gave pt handout. Again reminded pt the benefits of self soothing.           Suicidal/Homicidal: Nowithout intent/plan  Therapist Response: Assessed pt's current functioning and reviewed progress. Assisted pt processing issues with loss of relationship, positive coping tools, distress tolerance skills. Assisted pt processing for management of stressors.  Plan: See pt in 1 week   Diagnosis:      Axis I: F43.23                              Kyrsten Deleeuw S, LCAS 03/06/17

## 2017-03-06 NOTE — Progress Notes (Signed)
Daily Group Progress Note             Program: Outpatient Evening Group   Group Time: 5:30-6:30pm  Participation Level: Active  Behavioral Response: Appropriate  Type of Therapy:  Psychoeducation/Group process    Summary of Progress: Pt participated in an introductory group. Today is hihs initial session of the outpatient evening group. Pt articulated expectations of the group, what he needs to feel safe in the group He also gave some background information so the group can become more acquainted with patient.   Vernona RiegerLisbeth S Mackenzie, LCAS

## 2017-03-12 ENCOUNTER — Ambulatory Visit (HOSPITAL_COMMUNITY): Payer: Self-pay | Admitting: Licensed Clinical Social Worker

## 2017-03-15 MED FILL — traZODone HCL 100 MG TABS: 100 | 30 days supply | Qty: 60 | Fill #1

## 2017-03-19 ENCOUNTER — Ambulatory Visit (HOSPITAL_COMMUNITY): Payer: Self-pay | Admitting: Licensed Clinical Social Worker

## 2017-03-26 ENCOUNTER — Ambulatory Visit (HOSPITAL_COMMUNITY): Payer: Self-pay | Admitting: Licensed Clinical Social Worker

## 2017-03-27 ENCOUNTER — Encounter (HOSPITAL_COMMUNITY): Payer: Self-pay | Admitting: Licensed Clinical Social Worker

## 2017-03-27 ENCOUNTER — Ambulatory Visit (INDEPENDENT_AMBULATORY_CARE_PROVIDER_SITE_OTHER): Payer: 59 | Admitting: Licensed Clinical Social Worker

## 2017-03-27 DIAGNOSIS — F4323 Adjustment disorder with mixed anxiety and depressed mood: Secondary | ICD-10-CM | POA: Diagnosis not present

## 2017-03-27 NOTE — Progress Notes (Signed)
THERAPIST PROGRESS NOTE  Session Time: 2:10-3pm  Participation Level: Active  Behavioral Response: CasualAlert/Depressed anxious  Type of Therapy: Individual Therapy  Treatment Goals addressed: Coping/TX Plan review  Interventions: DBT  Summary: Derek Lindsey is a 27 y.o. male who presents  For his individual counseling session.Pt discussed his psychiatric symptoms and current life events. He is still experiencing symptoms of anxiety and depression.Made appt with Dr. Donell BeersPlovsky for pt for medication monitoring.  Pt continues to be stuck  in anger in his grief process. Pt explained his feelings of anger at his girlfriend. Pt is still expecting to receive closure in the relationship. Asked open ended questions and used empathic reflection. Processed again with him how to experience his own closure.Discussed with pt his low frustration tolerance. Taught pt distress tolerance skills:  ACCEPTS.                Suicidal/Homicidal: Nowithout intent/plan  Therapist Response: Assessed pt's current functioning and reviewed progress. Assisted pt processing issues with loss of relationship, positive coping tools, distress tolerance skills. Assisted pt processing for management of stressors.  Plan: See pt in 1 week   Diagnosis:      Axis I: F43.23                              MACKENZIE,LISBETH S, LCAS 03/27/17

## 2017-04-01 ENCOUNTER — Ambulatory Visit (INDEPENDENT_AMBULATORY_CARE_PROVIDER_SITE_OTHER): Payer: 59 | Admitting: Licensed Clinical Social Worker

## 2017-04-01 ENCOUNTER — Encounter (HOSPITAL_COMMUNITY): Payer: Self-pay | Admitting: Licensed Clinical Social Worker

## 2017-04-01 DIAGNOSIS — F4323 Adjustment disorder with mixed anxiety and depressed mood: Secondary | ICD-10-CM

## 2017-04-01 NOTE — Progress Notes (Signed)
THERAPIST PROGRESS NOTE  Session Time: 9:10-10am  Participation Level: Active  Behavioral Response: CasualAlert/Depressed anxious  Type of Therapy: Individual Therapy  Treatment Goals addressed: Coping  Interventions: DBT  Summary: Derek Lindsey is a 27 y.o. male who presents  For his individual counseling session.Pt discussed his psychiatric symptoms and current life events. Pt celebrated his birthday over the weekend, which was sad for pt due to his breakup. Asked pt open ended questions using empathic reflection. Discussed with pt his future goals for relationships. Asked pt: What role did you play into the demise of the relationship? Pt admits to self destructive behaviors. Asked pt open ended questions. Disused his inability to self identify.  Suggested to pt to buy book: Codependency no more, medolie beattie.   Suicidal/Homicidal: Nowithout intent/plan  Therapist Response: Assessed pt'Lindsey current functioning and reviewed progress. Assisted pt processing issues with loss of relationship, future goals for relationships, self destructive behaviors, inability to self identify. codependency..Assisted pt processing for management of stressors.  Plan: See pt in 2 weeks, homework: self destructive behaviors, codenpendcy no more book.  Diagnosis:      Axis I: F43.23                              Derek Lindsey, LCAS 04/01/17

## 2017-04-02 ENCOUNTER — Ambulatory Visit (HOSPITAL_COMMUNITY): Payer: Self-pay | Admitting: Licensed Clinical Social Worker

## 2017-04-08 ENCOUNTER — Ambulatory Visit (HOSPITAL_COMMUNITY): Payer: Self-pay | Admitting: Licensed Clinical Social Worker

## 2017-04-09 ENCOUNTER — Ambulatory Visit (HOSPITAL_COMMUNITY): Payer: Self-pay | Admitting: Licensed Clinical Social Worker

## 2017-04-10 ENCOUNTER — Ambulatory Visit (INDEPENDENT_AMBULATORY_CARE_PROVIDER_SITE_OTHER): Payer: 59 | Admitting: Psychiatry

## 2017-04-10 ENCOUNTER — Encounter (HOSPITAL_COMMUNITY): Payer: Self-pay | Admitting: Psychiatry

## 2017-04-10 VITALS — BP 120/76 | HR 57 | Ht 71.0 in | Wt 154.0 lb

## 2017-04-10 DIAGNOSIS — Z630 Problems in relationship with spouse or partner: Secondary | ICD-10-CM

## 2017-04-10 DIAGNOSIS — F4323 Adjustment disorder with mixed anxiety and depressed mood: Secondary | ICD-10-CM

## 2017-04-10 DIAGNOSIS — Z810 Family history of intellectual disabilities: Secondary | ICD-10-CM | POA: Diagnosis not present

## 2017-04-10 DIAGNOSIS — Z811 Family history of alcohol abuse and dependence: Secondary | ICD-10-CM

## 2017-04-10 DIAGNOSIS — F1099 Alcohol use, unspecified with unspecified alcohol-induced disorder: Secondary | ICD-10-CM | POA: Diagnosis not present

## 2017-04-10 DIAGNOSIS — Z818 Family history of other mental and behavioral disorders: Secondary | ICD-10-CM

## 2017-04-10 MED ORDER — TRAZODONE HCL 100 MG PO TABS: ORAL_TABLET | ORAL | 3 refills | Status: DC

## 2017-04-10 MED FILL — traZODone HCL 100 MG TABS: 100 | 30 days supply | Qty: 60 | Fill #0

## 2017-04-10 NOTE — Progress Notes (Signed)
Patient ID: Derek Lindsey, male   DOB: 16-Aug-1990, 27 y.o.   MRN: [id number] [facility] MD Progress Note  04/10/2017 5:00 PM Derek Lindsey  MRN:  [id number] Subjective: Better Principal Problem: Uncomplicated bereavement Today unfortunately we have start late because of a problem with her off his computer. Generally the patient is doing well. He still grieving the loss of his girlfriend. He saws ex-girlfriend one time by mistake. The patient still has not made any contact with her. The patient stays in psychotherapy at this setting and does well. His sleep is better taking trazodone 100 mg 2 at night. It takes him about 20-30 minutes to fall asleep but he sleeps through the night and does not have a hangover the next day. He says generally he is avoiding alcohol. He uses no drugs. Is eating well has good energy and continues to exercise. 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 Today the patient asked about taking Lexapro. Clarified with him that Lexapro all antidepressants is for major clinical depression. I shared with him that this is a syndrome of daily depression often with anhedonia and also affects her sleep and appetite. This really isn't the case.He acknowledges that this is really reactive depression. Therefore we'll continue his trazodone but offered no antidepressant at this time. He certainly is not suicidal. He seems to be functioning fairly well. He's agreed to return to see me in 3 months. By that time hopefully will be clear what school he is going to go to. It should be pointed out that he lives in [place] while his ex-girlfriend lives in [place]. He likely will be going to school in [place]. He has a good support system. Total Time spent with patient: 15 minutes  Past Psychiatric History:   Past Medical History:  Past Medical History:  Diagnosis Date  . ADD (attention deficit disorder with hyperactivity)   . Kidney calculi     Past Surgical History:   Procedure Laterality Date  . Basket Uteroscopy  approx "2-3 years ago"   Family History:  Family History  Problem Relation Age of Onset  . Cancer Mother        lung  . Anxiety disorder Father   . Depression Father   . Cancer Maternal Grandfather        lung  . Anxiety disorder Sister   . Depression Sister   . Dementia Maternal Grandmother   . Alcohol abuse Paternal Grandfather    Family Psychiatric  History:  Social History:  Social History   Substance and Sexual Activity  Alcohol Use Yes  . Alcohol/week: 3.0 oz  . Types: 5 Cans of beer per week   Comment: occasional     Social History   Substance and Sexual Activity  Drug Use No    Social History   Socioeconomic History  . Marital status: Single    Spouse name: None  . Number of children: None  . Years of education: None  . Highest education level: None  Social Needs  . Financial resource strain: None  . Food insecurity - worry: None  . Food insecurity - inability: None  . Transportation needs - medical: None  . Transportation needs - non-medical: None  Occupational History  . None  Tobacco Use  . Smoking status: Never Smoker  . Smokeless tobacco: Never Used  Substance and Sexual Activity  . Alcohol use: Yes    Alcohol/week: 3.0 oz    Types: 5 Cans  of beer per week    Comment: occasional  . Drug use: No  . Sexual activity: Yes    Partners: Female    Birth control/protection: Condom  Other Topics Concern  . None  Social History Narrative  . None   Additional Social History:                         Sleep: Good  Appetite:  Good  Current Medications: Current Outpatient Medications  Medication Sig Dispense Refill  . traZODone (DESYREL) 100 MG tablet 1  qhs  After 3 nights may  Increase to 2  qhs 60 tablet 3  . amphetamine-dextroamphetamine (ADDERALL) 10 MG tablet Take 10 mg by mouth daily as needed (ADHD).     Marland Kitchen. clonazePAM (KLONOPIN) 0.5 MG tablet 1 po qd prn anxiety (Patient not  taking: Reported on 01/21/2017) 30 tablet 1  . hydrOXYzine (VISTARIL) 25 MG capsule 1  qhs  May repeat (Patient not taking: Reported on 01/21/2017) 60 capsule 4  . propranolol (INDERAL) 10 MG tablet Take 1 tablet (10 mg total) by mouth 2 (two) times daily. (Patient not taking: Reported on 01/21/2017) 60 tablet 2   No current facility-administered medications for this visit.     Lab Results: No results found for this or any previous visit (from the past 48 hour(s)).  Physical Findings: AIMS:  , ,  ,  ,    CIWA:    COWS:     Musculoskeletal: Strength & Muscle Tone: within normal limits Gait & Station: normal Patient leans: Right  Psychiatric Specialty Exam: ROS  Blood pressure 120/76, pulse (!) 57, height 5\' 11"  (1.803 m), weight 154 lb (69.9 kg), SpO2 98 %.Body mass index is 21.48 kg/m.  General Appearance: Casual  Eye Contact::  Good  Speech:  Clear and Coherent  Volume:  Normal  Mood: Anxious   Affect:  Appropriate  Thought Process:  Coherent  Orientation:  NA  Thought Con normal   Suicidal Thoughts:  No  Homicidal Thoughts:  No  Memory:  NA  Judgement:  Good  Insight:  Good  Psychomotor Activity:  Normal  Concentration:  Good  Recall:  Good  Fund of Knowledge:Good  Language: Good  Akathisia:  No  Handed:  Right  AIMS (if indicated):     Assets:  Desire for Improvement  ADL's:  Intact  Cognition: WNL  Sleep:      Treatment Plan Summary: 04/10/2017, 5:00 PM  Today the patient will continue taking trazodone 200 mg at night.continue in one-to-one therapy and return to see me in 3 months. I do think this is grieving process and I think the patient is functioning fairly well. He continues to maintain her fairly good support system. I think he's tortured by the fact that he does not have any clarification and closure with his ex-girlfriend. It is my hope that he still opened future romantic relationships.

## 2017-04-15 ENCOUNTER — Ambulatory Visit (INDEPENDENT_AMBULATORY_CARE_PROVIDER_SITE_OTHER): Payer: 59 | Admitting: Licensed Clinical Social Worker

## 2017-04-15 ENCOUNTER — Encounter (HOSPITAL_COMMUNITY): Payer: Self-pay | Admitting: Licensed Clinical Social Worker

## 2017-04-15 DIAGNOSIS — F4323 Adjustment disorder with mixed anxiety and depressed mood: Secondary | ICD-10-CM | POA: Diagnosis not present

## 2017-04-15 NOTE — Progress Notes (Signed)
THERAPIST PROGRESS NOTE  Session Time: 9:10-10am  Participation Level: Active  Behavioral Response: CasualAlert/Depressed anxious  Type of Therapy: Individual Therapy  Treatment Goals addressed: Coping  Interventions: DBT  Summary: Derek Lindsey is a 27 y.o. male who presents  For his individual counseling session.Pt discussed his psychiatric symptoms and current life events.Pt still exhibits depressed mood with anxiety. Pt saw Dr. Donell BeersPlovsky, psychiatrist, for medication management. He continues on the Trazadone for sleep. Asked open ended questions about pt's next steps. He reports he does not know how to go on. Pt is unwilling to use any suggestions for moving forward in his life. He just wants to wallow in his grief. Asked open ended questions about his feelings and he was able to identify his feelings.Discussed his negative core belief, I am never going to have a healthy relationship."  Discussed distress tolerance with pt.  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       Suicidal/Homicidal: Nowithout intent/plan  Therapist Response: Assessed pt's current functioning and reviewed progress. Assisted pt processing issues with loss of relationship, future goals for relationships, stress tolerance skills, core beliefs..Assisted pt processing for management of stressors.  Plan: See pt in 2 weeks Diagnosis:      Axis I: F43.23                              Chin Wachter S, LCAS 04/15/17

## 2017-04-16 ENCOUNTER — Ambulatory Visit (HOSPITAL_COMMUNITY): Payer: Self-pay | Admitting: Licensed Clinical Social Worker

## 2017-04-17 MED FILL — AMPHETAMINE-DEXTROAMPHETAMI: 10 | 30 days supply | Qty: 30 | Fill #0

## 2017-04-17 MED FILL — ADDERALL XR 20 MG CAP SA: 20 | 30 days supply | Qty: 30 | Fill #0

## 2017-04-23 ENCOUNTER — Ambulatory Visit (HOSPITAL_COMMUNITY): Payer: Self-pay | Admitting: Licensed Clinical Social Worker

## 2017-04-30 ENCOUNTER — Ambulatory Visit (HOSPITAL_COMMUNITY): Payer: Self-pay | Admitting: Licensed Clinical Social Worker

## 2017-05-07 ENCOUNTER — Ambulatory Visit (HOSPITAL_COMMUNITY): Payer: Self-pay | Admitting: Licensed Clinical Social Worker

## 2017-05-07 ENCOUNTER — Ambulatory Visit (INDEPENDENT_AMBULATORY_CARE_PROVIDER_SITE_OTHER): Payer: BLUE CROSS/BLUE SHIELD | Admitting: Licensed Clinical Social Worker

## 2017-05-07 DIAGNOSIS — F4323 Adjustment disorder with mixed anxiety and depressed mood: Secondary | ICD-10-CM

## 2017-05-08 ENCOUNTER — Encounter (HOSPITAL_COMMUNITY): Payer: Self-pay | Admitting: Licensed Clinical Social Worker

## 2017-05-08 NOTE — Progress Notes (Signed)
THERAPIST PROGRESS NOTE  Session Time: 1:10-2pm  Participation Level: Active  Behavioral Response: CasualAlert/Depressed anxious  Type of Therapy: Individual Therapy  Treatment Goals addressed: Coping  Interventions: CBT  Summary: Derek Lindsey is a 27 y.o. male who presents  For his individual counseling session.Pt discussed his psychiatric symptoms and current life events. Pt presents tearful today and then begins to cry. "I hate my life and I'm so unhappy." Asked open ended questions and used empathic reflection. Pt has made some life affecting decisions. He is not going to apply to PA school. Asked open ended questions. Pt has some ideas about what he wants to do with his life. Assisted pt processing a plan and strategies. Role played with pt a conversation he will need to have with his parents. Pt wrote down bullet points. This session was the first that pt has not spoken about his relationship that ended in December. Validated pt on his choices and plan.   Suicidal/Homicidal: Nowithout intent/plan  Therapist Response: Assessed pt's current functioning and reviewed progress. Assisted pt processing life changing decisions, plan and strategies for the future, difficult discussion with parents..Assisted pt processing for management of stressors.  Plan: See pt in 2 weeks Diagnosis:      Axis I: F43.23                              MACKENZIE,LISBETH S, LCAS 05/07/17

## 2017-05-13 ENCOUNTER — Ambulatory Visit (INDEPENDENT_AMBULATORY_CARE_PROVIDER_SITE_OTHER): Payer: BLUE CROSS/BLUE SHIELD | Admitting: Licensed Clinical Social Worker

## 2017-05-13 ENCOUNTER — Encounter (HOSPITAL_COMMUNITY): Payer: Self-pay | Admitting: Licensed Clinical Social Worker

## 2017-05-13 DIAGNOSIS — F4323 Adjustment disorder with mixed anxiety and depressed mood: Secondary | ICD-10-CM

## 2017-05-13 NOTE — Progress Notes (Addendum)
THERAPIST PROGRESS NOTE  Session Time: 1:10-2pm  Participation Level: Active  Behavioral Response: CasualAlert/Depressed anxious  Type of Therapy: Individual Therapy  Treatment Goals addressed: Coping  Interventions: CBT  Summary: Derek Lindsey is a 27 y.o. male who presents  For his individual counseling session.Pt discussed his psychiatric symptoms and current life events. Pt presents less unhappy today. He talked with his parents about his decisions about his future. He modified his decisions somewhat after discussion with parents. Asked open ended questions about the discussion and used empathic reflection. Pt did ruminate some on his x relationship and become angry. Talked about his stage of loss. Discussed with pt the importance of his continuation to work on himself and not become engaged in another relationship. Pt was in agreement of the suggestion and discussed options. Pt will apply to PA school but only those not requiring GRE. The next step in his plan is to find a full time job and [place]. Pt feels this plan will improve pt's level of happiness. Validated pt's thoughts, feelings and plans.   Suicidal/Homicidal: Nowithout intent/plan  Therapist Response: Assessed pt's current functioning and reviewed progress. Assisted pt processing life changing decisions, plan and strategies for the future, x relationship.Assisted pt processing for management of stressors.  Plan: See pt in 2 weeks Diagnosis:      Axis I: F43.23                              MACKENZIE,LISBETH S, LCAS 05/13/17

## 2017-05-14 ENCOUNTER — Ambulatory Visit (HOSPITAL_COMMUNITY): Payer: Self-pay | Admitting: Licensed Clinical Social Worker

## 2017-05-21 ENCOUNTER — Encounter (HOSPITAL_COMMUNITY): Payer: Self-pay | Admitting: Licensed Clinical Social Worker

## 2017-05-21 ENCOUNTER — Ambulatory Visit (INDEPENDENT_AMBULATORY_CARE_PROVIDER_SITE_OTHER): Payer: BLUE CROSS/BLUE SHIELD | Admitting: Licensed Clinical Social Worker

## 2017-05-21 ENCOUNTER — Ambulatory Visit (HOSPITAL_COMMUNITY): Payer: Self-pay | Admitting: Licensed Clinical Social Worker

## 2017-05-21 DIAGNOSIS — F4323 Adjustment disorder with mixed anxiety and depressed mood: Secondary | ICD-10-CM

## 2017-05-21 NOTE — Progress Notes (Signed)
THERAPIST PROGRESS NOTE  Session Time: 11:10-12pm  Participation Level: Active  Behavioral Response: CasualAlert/Depressed anxious  Type of Therapy: Individual Therapy  Treatment Goals addressed: Coping  Interventions: CBT  Summary: Derek Lindsey is a 27 y.o. male who presents  For his individual counseling session.Pt discussed his psychiatric symptoms and current life events. Pt presented agitated today. His frustration level was low and he was negative. Asked pt open ended questions.  Discussed low frustration tolerance with pt. Taught pt some emotional regulation skills. Pt has a plan for the next few years of his life but wants it today and is tired of living with his parents. Discussed moving forward with his life. Pt was receptive to skill training and discussions and was somewhat less agitated at end of session.    Suicidal/Homicidal: Nowithout intent/plan  Therapist Response: Assessed pt's current functioning and reviewed progress. Assisted pt processing life changing decisions, plan and strategies for the future, low frustration tolerance, emotional regulation skills. Assisted pt processing for management of stressors.  Plan: See pt in 2 weeks Diagnosis:      Axis I: F43.23                              MACKENZIE,LISBETH S, LCAS 05/21/17

## 2017-05-28 ENCOUNTER — Ambulatory Visit (HOSPITAL_COMMUNITY): Payer: Self-pay | Admitting: Licensed Clinical Social Worker

## 2017-06-04 ENCOUNTER — Ambulatory Visit (HOSPITAL_COMMUNITY): Payer: Self-pay | Admitting: Licensed Clinical Social Worker

## 2017-06-05 MED FILL — AMPHETAMINE SALTS 10 MG TAB: 10 | 30 days supply | Qty: 30 | Fill #0

## 2017-06-05 MED FILL — ADDERALL XR 20 MG CAP SA: 20 | 30 days supply | Qty: 30 | Fill #0

## 2017-06-11 ENCOUNTER — Ambulatory Visit (HOSPITAL_COMMUNITY): Payer: Self-pay | Admitting: Licensed Clinical Social Worker

## 2017-06-11 ENCOUNTER — Ambulatory Visit (INDEPENDENT_AMBULATORY_CARE_PROVIDER_SITE_OTHER): Payer: BLUE CROSS/BLUE SHIELD | Admitting: Licensed Clinical Social Worker

## 2017-06-11 ENCOUNTER — Encounter (HOSPITAL_COMMUNITY): Payer: Self-pay | Admitting: Licensed Clinical Social Worker

## 2017-06-11 DIAGNOSIS — F4323 Adjustment disorder with mixed anxiety and depressed mood: Secondary | ICD-10-CM | POA: Diagnosis not present

## 2017-06-11 MED FILL — traZODone HCL 100 MG TABS: 100 | 30 days supply | Qty: 60 | Fill #1
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
## 2017-06-18 ENCOUNTER — Ambulatory Visit (HOSPITAL_COMMUNITY): Payer: Self-pay | Admitting: Licensed Clinical Social Worker

## 2017-06-25 ENCOUNTER — Ambulatory Visit (INDEPENDENT_AMBULATORY_CARE_PROVIDER_SITE_OTHER): Payer: BLUE CROSS/BLUE SHIELD | Admitting: Licensed Clinical Social Worker

## 2017-06-25 DIAGNOSIS — F4323 Adjustment disorder with mixed anxiety and depressed mood: Secondary | ICD-10-CM | POA: Diagnosis not present

## 2017-06-26 ENCOUNTER — Encounter (HOSPITAL_COMMUNITY): Payer: Self-pay | Admitting: Licensed Clinical Social Worker

## 2017-06-26 NOTE — Progress Notes (Signed)
THERAPIST PROGRESS NOTE  Session Time: 1:10-2pm  Participation Level: Active  Behavioral Response: CasualAlert/Euthymic  Type of Therapy: Individual Therapy  Treatment Goals addressed: Coping  Interventions: CBT  Summary: Derek Lindsey is a 27 y.o. male who presents  For his individual counseling session.Pt discussed his psychiatric symptoms and current life events. Pt present wnl to his appt today. He is focusing on his future and letting go of the past. Asked open ended questions. Pt is dating, but not getting wrapped up in a relationship. Moving out of his parents house to his own house, working more hours. 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 Going on vacation with some of his friends to [place] Pt reports he still has communication and relational problems with his parents. Asked open ended questions. This is an ongoing issue. Continue to discuss with pt his style of communication with his parents affects how they communicate with him. Role played more effective communication skills.  Suicidal/Homicidal: Nowithout intent/plan  Therapist Response: Assessed pt's current functioning and reviewed progress. Assisted pt processing life changing decisions, plan and strategies for the future, effective communication skills. Assisted pt processing for management of stressors.  Plan: See pt in 2 weeks. Discuss unconditional love   Diagnosis:      Axis I: F43.23                              Darielys Giglia S, LCAS 06/25/17

## 2017-07-03 MED FILL — AMOXICILLIN 500 MG CAPSULE: 500 | 7 days supply | Qty: 21 | Fill #0

## 2017-07-11 ENCOUNTER — Ambulatory Visit (HOSPITAL_COMMUNITY): Payer: Self-pay | Admitting: Psychiatry

## 2017-07-17 ENCOUNTER — Ambulatory Visit (HOSPITAL_COMMUNITY): Payer: Self-pay | Admitting: Psychiatry

## 2017-07-25 ENCOUNTER — Encounter

## 2017-07-25 ENCOUNTER — Ambulatory Visit (INDEPENDENT_AMBULATORY_CARE_PROVIDER_SITE_OTHER): Payer: BLUE CROSS/BLUE SHIELD | Admitting: Licensed Clinical Social Worker

## 2017-07-25 ENCOUNTER — Telehealth (HOSPITAL_COMMUNITY): Payer: Self-pay | Admitting: Licensed Clinical Social Worker

## 2017-07-25 ENCOUNTER — Encounter (HOSPITAL_COMMUNITY): Payer: Self-pay | Admitting: Licensed Clinical Social Worker

## 2017-07-25 DIAGNOSIS — F4323 Adjustment disorder with mixed anxiety and depressed mood: Secondary | ICD-10-CM

## 2017-07-25 NOTE — Progress Notes (Signed)
THERAPIST PROGRESS NOTE  Session Time: 3:30-4:15pm  Participation Level: Active  Behavioral Response: CasualAlert/Euthymic  Type of Therapy: Family therapy without patient  Treatment Goals addressed: Coping  Interventions: MI  Summary: Derek Lindsey is a 27 y.o. male whose mother presents for a family session without pt present. Pt had to work today and mother wanted to be seen individually. Pt agreed to this. Discussed pt's volatile behavior in the house and his continued anger. "He is an angry young man." Asked open ended questions and used empathic reflection. Mother thinks pt has a lack of control and impulsivity. Pt experienced separation anxiety in the 2nd grade and was depressed at age 68 when his younger brother was born. Talked with pt's mother about having pt and his parents in the same room to talk to each other and being heard in a safe environment. Pt's mother was in agreement with this suggestion.   Suicidal/Homicidal: Nowithout intent/plan  Therapist Response: Met with pt's mother with agreement of pt. Pt's mother gave information on her impression of pt's behavior at home. Pt's mother agreed to another session with her husband and pt to talk to each other in a safe environment being heard.    Plan: See pt in 2 weeks.    Diagnosis:      Axis I: F43.23                              MACKENZIE,LISBETH S, LCAS 07/25/17

## 2017-07-25 NOTE — Telephone Encounter (Signed)
Called pt about the need for ROI to meet with pt's mother today. Pt cannot attend due to being called in to work today. Tori Charity fundraiser(Front desk staff) heard Pt give permission to therapist to speak with his mother in a session today. Pt will sign ROI at next appt.    By Vernona RiegerMackenzie, Lisbeth S, LCAS

## 2017-07-29 MED FILL — ADDERALL XR 20 MG CAP SA: 20 | 30 days supply | Qty: 30 | Fill #0

## 2017-07-29 MED FILL — DEXTROAMP-AMP 10 MG TAB: 10 | 30 days supply | Qty: 30 | Fill #0

## 2017-08-07 ENCOUNTER — Ambulatory Visit (INDEPENDENT_AMBULATORY_CARE_PROVIDER_SITE_OTHER): Payer: BLUE CROSS/BLUE SHIELD | Admitting: Licensed Clinical Social Worker

## 2017-08-07 ENCOUNTER — Encounter (HOSPITAL_COMMUNITY): Payer: Self-pay | Admitting: Licensed Clinical Social Worker

## 2017-08-07 DIAGNOSIS — F4323 Adjustment disorder with mixed anxiety and depressed mood: Secondary | ICD-10-CM

## 2017-08-07 NOTE — Progress Notes (Signed)
THERAPIST PROGRESS NOTE  Session Time: 9:10-10am  Participation Level: Active  Behavioral Response: CasualAlert/Anxious  Type of Therapy: Individual Therapy  Treatment Goals addressed: Coping  Interventions: CBT  Summary: Derek Lindsey is a 27 y.o. male who presents  For his individual counseling session.Pt discussed his psychiatric symptoms and current life events. Pt present anxious to his appt today. His dog is sick and has been at the vet with him this morning. Pt continues to focus on his future. 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 Asked open ended questions. Pt shared he is beginning to let go of the past. He is seeing other young women, went sky [place] and [place]. He shared, "i'm living my life, doing what you suggested, having fun."Asked open ended questions. Discussed pt's future and his goals. Asked open ended questions. Discussed relationships and his goals. Asked open ended questions.      Suicidal/Homicidal: Nowithout intent/plan  Therapist Response: Assessed pt's current functioning and reviewed progress. Assisted pt processing life changing decisions, plan and strategies for the future.  Assisted pt processing for management of stressors.  Plan: See pt in 2 weeks. Diagnosis:      Axis I: F43.23                              Cora Brierley S, LCAS 08/07/17

## 2017-08-08 ENCOUNTER — Ambulatory Visit (HOSPITAL_COMMUNITY): Payer: Self-pay | Admitting: Licensed Clinical Social Worker

## 2017-08-22 ENCOUNTER — Ambulatory Visit (HOSPITAL_COMMUNITY): Payer: Self-pay | Admitting: Licensed Clinical Social Worker

## 2017-09-09 MED FILL — AMPHETAMINE-DEXTROAMPHETAMI: 10 | 30 days supply | Qty: 30 | Fill #0

## 2017-09-09 MED FILL — ADDERALL XR 20 MG CAP SA: 20 | 30 days supply | Qty: 30 | Fill #0

## 2017-09-10 ENCOUNTER — Encounter (HOSPITAL_COMMUNITY): Payer: Self-pay | Admitting: Licensed Clinical Social Worker

## 2017-09-10 ENCOUNTER — Ambulatory Visit (INDEPENDENT_AMBULATORY_CARE_PROVIDER_SITE_OTHER): Payer: BLUE CROSS/BLUE SHIELD | Admitting: Licensed Clinical Social Worker

## 2017-09-10 DIAGNOSIS — F4323 Adjustment disorder with mixed anxiety and depressed mood: Secondary | ICD-10-CM

## 2017-09-10 NOTE — Progress Notes (Signed)
THERAPIST PROGRESS NOTE  Session Time: 9:10-10am  Participation Level: Active  Behavioral Response: CasualAlert/Anxious/Euthymic  Type of Therapy: Individual Therapy  Treatment Goals addressed: Coping  Interventions: CBT  Summary: Al DecantChristopher F Highbaugh is a 27 y.o. male who presents  For his individual counseling session.Pt discussed his psychiatric symptoms and current life events. Pt presented without depressive symptoms or anxious. He feels his moods are stabilized. He has a lot of things in his life to be hopeful for. He has a new job with [employer] he has a new living situation. He has not seen his parents a lot so they are not fighting. He is starting a new relationship. Discussed this relationship at length. Pt was open to possibilities of how a relationship can affect what he is trying to accomplish, however he wants to keep an open mind. Pt is trying to be realistic about his future and how a relationship can affect it. He looked back at previous relationships and saw how intense he makes them. Asked open ended questions. Pt was open to discussion.    Suicidal/Homicidal: Nowithout intent/plan  Therapist Response: Assessed pt's current functioning and reviewed progress. Assisted pt processing relationships - past and present and future, life changing decisions, plan and strategies for the future.  Assisted pt processing for management of stressors.  Plan: See pt in 2 weeks.  Diagnosis:      Axis I: F43.23                              Cherie Lasalle S, LCAS 09/10/17

## 2017-09-16 ENCOUNTER — Ambulatory Visit (HOSPITAL_COMMUNITY): Payer: Self-pay | Admitting: Licensed Clinical Social Worker

## 2017-09-24 ENCOUNTER — Ambulatory Visit (INDEPENDENT_AMBULATORY_CARE_PROVIDER_SITE_OTHER): Payer: BLUE CROSS/BLUE SHIELD | Admitting: Licensed Clinical Social Worker

## 2017-09-24 ENCOUNTER — Encounter (HOSPITAL_COMMUNITY): Payer: Self-pay | Admitting: Licensed Clinical Social Worker

## 2017-09-24 DIAGNOSIS — F4323 Adjustment disorder with mixed anxiety and depressed mood: Secondary | ICD-10-CM

## 2017-09-24 NOTE — Progress Notes (Signed)
THERAPIST PROGRESS NOTE  Session Time: 9:10-10am  Participation Level: Active  Behavioral Response: CasualAlert/Anxious/Anxious  Type of Therapy: Individual Therapy  Treatment Goals addressed: Coping  Interventions: CBT  Summary: Derek Lindsey is a 27 y.o. male who presents  For his individual counseling session.Pt discussed his psychiatric symptoms and current life events. Pt presented anxious today. He has been experiencing some changes in his life. Pt does not do well with change (fears it). Discussed with pt fear, being a powerful emotion. Fear is about something going wrong in the future, rather than right now. Taught pt process, purpose and practice of mindfulness skills. Pt has a new job with [employer] starting 9/15; he has a new living situation, but has not moved there full time even though paying rent. He has a new relationship which is moving faster than he wanted. He has not seen his parents a lot so they are not fighting.  Asked open ended questions. Pt was open to discussion about all changes, dealing with fear and mindfulness skills.    Suicidal/Homicidal: Nowithout intent/plan  Therapist Response: Assessed pt's current functioning and reviewed progress. Assisted pt processing relationships, life changing decisions, adjustments. Assisted pt processing for management of stressors.  Plan: See pt in 2 weeks.  Diagnosis:      Axis I: F43.23                              Derek Lindsey S, LCAS 09/24/17

## 2017-10-10 ENCOUNTER — Ambulatory Visit (HOSPITAL_COMMUNITY): Payer: Self-pay | Admitting: Licensed Clinical Social Worker

## 2017-10-15 MED FILL — AMPHETAMINE SALTS 10 MG TAB: 10 | 30 days supply | Qty: 30 | Fill #0

## 2017-10-15 MED FILL — ADDERALL XR 20 MG CAP SA: 20 | 30 days supply | Qty: 30 | Fill #0

## 2017-11-06 ENCOUNTER — Encounter (HOSPITAL_COMMUNITY): Payer: Self-pay | Admitting: Licensed Clinical Social Worker

## 2017-11-06 ENCOUNTER — Ambulatory Visit (INDEPENDENT_AMBULATORY_CARE_PROVIDER_SITE_OTHER): Payer: BLUE CROSS/BLUE SHIELD | Admitting: Licensed Clinical Social Worker

## 2017-11-06 DIAGNOSIS — F4323 Adjustment disorder with mixed anxiety and depressed mood: Secondary | ICD-10-CM | POA: Diagnosis not present

## 2017-11-06 NOTE — Progress Notes (Signed)
THERAPIST PROGRESS NOTE  Session Time: 5:30-6:30pm       Participation Level: Active  Behavioral Response: CasualAlert/Euthymic  Type of Therapy: Individual Therapy  Treatment Goals addressed:   Interventions: CBT  Summary: Derek Lindsey is a 27 y.o. male who presents  For his individual counseling session.Pt discussed his psychiatric symptoms and current life events. Pt was in his [employer] uniform. 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 Pt has heard from [employer], and is waitlisted for PA school. Pt is maintaining an adult relationship with his girlfriend who lives in [place]. Pt has fully moved into his rental house he shares with 2 other guys. "Life is good right now." He is moving forward in his adult life. Reminded pt life is not always good but he has matured and worked on his emotional health and grown into a healthy young man. Again, educated pt on mindfulness skills that he can use to stay focused on the present, not thinking and reacting to the future. Will continue to see patient 1x per month.      Suicidal/Homicidal: Nowithout intent/plan  Therapist Response: Assessed pt's current functioning and reviewed progress. Assisted pt processing relationships, life changing decisions, adjustments. Assisted pt processing for management of stressors.  Plan: See pt in 2 weeks.  Diagnosis:      Axis I: F43.23                              Sharnita Bogucki S, LCAS 11/06/17

## 2017-11-22 MED FILL — AMPHETAMINE-DEXTROAMPHETAMI: 10 | 30 days supply | Qty: 30 | Fill #0

## 2017-11-22 MED FILL — ADDERALL XR 20 MG CAP SA: 20 | 30 days supply | Qty: 30 | Fill #0

## 2017-12-04 ENCOUNTER — Ambulatory Visit (INDEPENDENT_AMBULATORY_CARE_PROVIDER_SITE_OTHER): Payer: BLUE CROSS/BLUE SHIELD | Admitting: Licensed Clinical Social Worker

## 2017-12-04 ENCOUNTER — Encounter (HOSPITAL_COMMUNITY): Payer: Self-pay | Admitting: Licensed Clinical Social Worker

## 2017-12-04 DIAGNOSIS — F4323 Adjustment disorder with mixed anxiety and depressed mood: Secondary | ICD-10-CM

## 2017-12-04 NOTE — Progress Notes (Signed)
THERAPIST PROGRESS NOTE  Session Time: 2:30-3:20pm       Participation Level: Active  Behavioral Response: CasualAlert/Anxious  Type of Therapy: Individual Therapy  Treatment Goals addressed:   Interventions: CBT  Summary: Derek Lindsey is a 27 y.o. male who presents  For his individual counseling session.Pt discussed his psychiatric symptoms and current life events. 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 Discussed sleep hygiene with pt. Pt still awaits to hear from PA school. Pt still maintains a long-distance relationship. Pt discussed some odd behaviors his girlfriend has exhibited and then revealed she is an ACOA. Gave pt handouts on ACOA and answered questions and processed ACOA behaviors. Reviewed handouts with pt. Discussed the "reality" of the problem: pt just discovered his girlfriend is not perfect. Discussed this at length.  Will continue to see pt 1x per month.        Suicidal/Homicidal: Nowithout intent/plan  Therapist Response: Assessed pt's current functioning and reviewed progress. Assisted pt processing relationships, ACOA, sleep hygiene, adjustments. Assisted pt processing for management of stressors.  Plan: See pt in 4 weeks.  Diagnosis:      Axis I: F43.23                              Harlin Mazzoni S, LCAS 12/04/17

## 2018-01-01 ENCOUNTER — Ambulatory Visit (HOSPITAL_COMMUNITY): Payer: Self-pay | Admitting: Licensed Clinical Social Worker

## 2018-01-16 ENCOUNTER — Encounter (HOSPITAL_COMMUNITY): Payer: Self-pay | Admitting: Licensed Clinical Social Worker

## 2018-01-16 ENCOUNTER — Ambulatory Visit (INDEPENDENT_AMBULATORY_CARE_PROVIDER_SITE_OTHER): Payer: BLUE CROSS/BLUE SHIELD | Admitting: Licensed Clinical Social Worker

## 2018-01-16 DIAGNOSIS — F4323 Adjustment disorder with mixed anxiety and depressed mood: Secondary | ICD-10-CM

## 2018-01-16 NOTE — Progress Notes (Signed)
THERAPIST PROGRESS NOTE  Session Time: 2:30-3:20pm       Participation Level: Active  Behavioral Response: CasualAlert/Anxious  Type of Therapy: Individual Therapy  Treatment Goals addressed:   Interventions: CBT  Summary: Al DecantChristopher F Mathieson is a 27 y.o. male who presents for his individual counseling session.Pt discussed his psychiatric symptoms and current life events. Pt got in [place] school at [employer] [place]. He is excited about this new life experience. He has been working towards this for 4 years! Pt is enjoying his job as [employer] and is adjusting to 3rd shift and using sleep hygiene. Discussed at length his long-term relationship, now that he's been accepted to PA school. Pt discussed his girlfriend and her continued ACOA symptoms. Pt still struggles with discovering his girlfriend is not perfect. Discussed reasons behind perfectionism. Discussed this at length.  Will continue to see pt 1x per month.        Suicidal/Homicidal: Nowithout intent/plan  Therapist Response: Assessed pt's current functioning and reviewed progress. Assisted pt processing relationships, ACOA, PA school, future, adjustments. Assisted pt processing for management of stressors.  Plan: See pt in 4 weeks.  Diagnosis:      Axis I: F43.23                              Elena Davia S, LCAS 01/16/18

## 2018-02-03 ENCOUNTER — Ambulatory Visit (INDEPENDENT_AMBULATORY_CARE_PROVIDER_SITE_OTHER): Payer: BLUE CROSS/BLUE SHIELD | Admitting: Licensed Clinical Social Worker

## 2018-02-03 DIAGNOSIS — F4323 Adjustment disorder with mixed anxiety and depressed mood: Secondary | ICD-10-CM | POA: Diagnosis not present

## 2018-02-04 ENCOUNTER — Telehealth (HOSPITAL_COMMUNITY): Payer: Self-pay | Admitting: Licensed Clinical Social Worker

## 2018-02-04 ENCOUNTER — Encounter (HOSPITAL_COMMUNITY): Payer: Self-pay | Admitting: Licensed Clinical Social Worker

## 2018-02-04 NOTE — Progress Notes (Signed)
THERAPIST PROGRESS NOTE  Session Time: 2:10-3:00pm       Participation Level: Active  Behavioral Response: CasualAlert/Anxious/ Depressed  Type of Therapy: Individual Therapy  Treatment Goals addressed: Improve Psychiatric Symptoms, elevate mood (increased self-esteem, increased self-compassion, increased interaction), improve unhelpful thought patterns, controlled behavior, moderate mood, deliberate speech and thought process(improved social functioning, healthy adjustment to living situation), Learn about diagnosis, healthy coping skills.  Interventions: CBT  Summary: Derek Lindsey is a 28 y.o. male who presents for his individual counseling session.Pt discussed his psychiatric symptoms and current life events. Pt presents with depressive and anxiety symptoms. He had left a message while I was out of the office wanting an appt asap. He and his girlfriend broke up and he was very distraught, crying. Pt discussed the breakup tearfully. Processed the relationship with pt. Asked pt: What part did you play into the demise of the relationship? Would you want the relationship back the way it was? What do you miss about the relationship? Pt reports his anxiety has increased immensely and was unable to work 2 days due to not sleeping and his anxiety symptoms. Discussed sleep hygiene with pt and coping skills to deal with his current anxiety symptoms.    Suicidal/Homicidal: Nowithout intent/plan  Therapist Response: Assessed pt's current functioning and reviewed progress. Assisted pt processing relationship, break up, , future, adjustments. Assisted pt processing for management of stressors.  Plan: See pt in 4 weeks.  Diagnosis:      Axis I: F43.23                              Derek Lindsey S, LCAS 02/03/2018

## 2018-02-04 NOTE — Telephone Encounter (Signed)
Pt called. I was out of the office. he was upset as his girlfriend broke up with him. He asked for an appt asap as he is struggling to cope with the breakup.  Pt called in 12/30 at 5:06pm Ottis Stain, MS, LCAS

## 2018-02-10 MED FILL — ADDERALL XR 20 MG CAP SA: 20 | 30 days supply | Qty: 30 | Fill #0

## 2018-02-10 MED FILL — AMPHETAMINE-DEXTROAMPHETAMI: 10 | 30 days supply | Qty: 30 | Fill #0

## 2018-03-10 ENCOUNTER — Ambulatory Visit (HOSPITAL_COMMUNITY): Payer: BLUE CROSS/BLUE SHIELD | Admitting: Licensed Clinical Social Worker

## 2018-04-16 ENCOUNTER — Other Ambulatory Visit: Payer: Self-pay

## 2018-04-16 ENCOUNTER — Ambulatory Visit (HOSPITAL_COMMUNITY)
Admission: EM | Admit: 2018-04-16 | Discharge: 2018-04-16 | Disposition: A | Discharge status: Home / Self Care | Payer: 59 | Attending: Family Medicine | Admitting: Family Medicine

## 2018-04-16 ENCOUNTER — Encounter (HOSPITAL_COMMUNITY): Payer: Self-pay | Admitting: Physician Assistant

## 2018-04-16 DIAGNOSIS — J069 Acute upper respiratory infection, unspecified: Secondary | ICD-10-CM

## 2018-04-16 MED ORDER — FLUTICASONE PROPIONATE 50 MCG/ACT NA SUSP: 2.0000 | Freq: Every day | NASAL | 0 refills | Status: DC

## 2018-04-16 MED ORDER — BENZONATATE 100 MG PO CAPS: 100.0000 mg | ORAL_CAPSULE | Freq: Three times a day (TID) | ORAL | 0 refills | Status: DC

## 2018-04-16 MED ORDER — IPRATROPIUM BROMIDE 0.06 % NA SOLN: 2.0000 | Freq: Four times a day (QID) | NASAL | 0 refills | Status: DC

## 2018-04-16 MED FILL — IPRATROPIUM 0.06% SPRAY: 0.06 | 21 days supply | Qty: 15 | Fill #0

## 2018-04-16 MED FILL — FLUTICASONE PROP 50 MCG SPR: 50 | 30 days supply | Qty: 16 | Fill #0

## 2018-04-16 MED FILL — BENZONATATE 100 MG CAPS: 100 | 7 days supply | Qty: 21 | Fill #0

## 2018-04-16 NOTE — ED Provider Notes (Signed)
[facility]    CSN: [id number] Arrival date & time: 04/16/18  1003     History   Chief Complaint Chief Complaint  Patient presents with  . URI    HPI Derek Lindsey is a [age] y.o. male.   28 year old male comes in for URI symptoms. States he traveled to [place] through [place] 3/6-3/8. States felt generalized weakness on 3/8 that resolved the next day. He started having sore throat, malaise 3/11. And felt some chest tightness, mild cough, rhinorrhea, nasal congestion since then. Noticed shortness of breath yesterday where he feels as if he needs to take a deep breath every so often to feel comfortable. No fever, chills, night sweats. No history of asthma. Former smoker, states he vaped for 6 months, stopped 12/2017. He has had a flu shot this year. Has not taken anything for his symptoms.       Past Medical History:  Diagnosis Date  . ADD (attention deficit disorder with hyperactivity)   . Kidney calculi     Patient Active Problem List   Diagnosis Date Noted  . Alcohol dependence (HCC) 07/28/2015  . Alcohol use disorder, severe, in early remission (HCC) 07/28/2015  . Groin strain-right 08/15/2012  . CHEST PAIN 04/20/2009    Past Surgical History:  Procedure Laterality Date  . Basket Uteroscopy  approx "2-3 years ago"       Home Medications    Prior to Admission medications   Medication Sig Start Date End Date Taking? Authorizing Provider  amphetamine-dextroamphetamine (ADDERALL) 10 MG tablet Take 10 mg by mouth daily as needed (ADHD).  01/14/17 02/13/17  [provider]  benzonatate (TESSALON) 100 MG capsule Take 1 capsule (100 mg total) by mouth every 8 (eight) hours. 04/16/18   Belinda FisherYu, Loranda Mastel V, PA-C  clonazePAM (KLONOPIN) 0.5 MG tablet 1 po qd prn anxiety Patient not taking: Reported on 01/21/2017 08/03/15   Archer AsaPlovsky, Gerald, MD  fluticasone (FLONASE) 50 MCG/ACT nasal spray Place 2 sprays into both nostrils daily. 04/16/18   Belinda FisherYu, Davin Muramoto V, PA-C   hydrOXYzine (VISTARIL) 25 MG capsule 1  qhs  May repeat Patient not taking: Reported on 01/21/2017 09/07/15   Archer AsaPlovsky, Gerald, MD  ipratropium (ATROVENT) 0.06 % nasal spray Place 2 sprays into both nostrils 4 (four) times daily. 04/16/18   Cathie HoopsYu, Stefanie Hodgens V, PA-C  propranolol (INDERAL) 10 MG tablet Take 1 tablet (10 mg total) by mouth 2 (two) times daily. Patient not taking: Reported on 01/21/2017 08/03/15   Archer AsaPlovsky, Gerald, MD  traZODone (DESYREL) 100 MG tablet 1  qhs  After 3 nights may  Increase to 2  qhs 04/10/17   Archer AsaPlovsky, Gerald, MD    Family History Family History  Problem Relation Age of Onset  . Cancer Mother        lung  . Anxiety disorder Father   . Depression Father   . Cancer Maternal Grandfather        lung  . Anxiety disorder Sister   . Depression Sister   . Dementia Maternal Grandmother   . Alcohol abuse Paternal Grandfather     Social History Social History   Tobacco Use  . Smoking status: Never Smoker  . Smokeless tobacco: Never Used  Substance Use Topics  . Alcohol use: Yes    Alcohol/week: 5.0 standard drinks    Types: 5 Cans of beer per week    Comment: occasional  . Drug use: No     Allergies   Avocado and Kiwi  extract   Review of Systems Review of Systems  Reason unable to perform ROS: See HPI as above.     Physical Exam Triage Vital Signs ED Triage Vitals [04/16/18 1042]  Enc Vitals Group     BP 120/84     Pulse Rate 81     Resp 18     Temp 98.4 F (36.9 C)     Temp Source Oral     SpO2 98 %     Weight      Height      Head Circumference      Peak Flow      Pain Score      Pain Loc      Pain Edu?      Excl. in [facility]?    No data found.  Updated Vital Signs BP 120/84 (BP Location: Left Arm)   Pulse 81   Temp 98.4 F (36.9 C) (Oral) Comment: has had no antipyretic   Resp 18   SpO2 98%   Physical Exam Constitutional:      General: He is not in acute distress.    Appearance: He is well-developed. He is not ill-appearing,  toxic-appearing or diaphoretic.  HENT:     Head: Normocephalic and atraumatic.     Right Ear: Tympanic membrane, ear canal and external ear normal. Tympanic membrane is not erythematous or bulging.     Left Ear: Tympanic membrane, ear canal and external ear normal. Tympanic membrane is not erythematous or bulging.     Nose: Nose normal.     Right Sinus: No maxillary sinus tenderness or frontal sinus tenderness.     Left Sinus: No maxillary sinus tenderness or frontal sinus tenderness.     Mouth/Throat:     Mouth: Mucous membranes are moist.     Pharynx: Oropharynx is clear. Uvula midline.  Eyes:     Conjunctiva/sclera: Conjunctivae normal.     Pupils: Pupils are equal, round, and reactive to light.  Neck:     Musculoskeletal: Normal range of motion and neck supple.  Cardiovascular:     Rate and Rhythm: Normal rate and regular rhythm.     Heart sounds: Normal heart sounds. No murmur. No friction rub. No gallop.   Pulmonary:     Effort: Pulmonary effort is normal. No accessory muscle usage, prolonged expiration, respiratory distress or retractions.     Breath sounds: Normal breath sounds. No stridor, decreased air movement or transmitted upper airway sounds. No decreased breath sounds, wheezing, rhonchi or rales.  Skin:    General: Skin is warm and dry.  Neurological:     Mental Status: He is alert and oriented to person, place, and time.      UC Treatments / Results  Labs (all labs ordered are listed, but only abnormal results are displayed) Labs Reviewed - No data to display  EKG None  Radiology No results found.  Procedures Procedures (including critical care time)  Medications Ordered in UC Medications - No data to display  Initial Impression / Assessment and Plan / UC Course  I have reviewed the triage vital signs and the nursing notes.  Pertinent labs & imaging results that were available during my care of the patient were reviewed by me and considered in my  medical decision making (see chart for details).    Exam unremarkable. Patient afebrile in office without any antipyretics. He is speaking in full sentences without difficulty without signs of respiratory distress. Lungs are CTAB. Low risk for  COVID at this time. Will have patient limit contact at this time and will treat symptomatically. If develop fever, to contact COVID hotline for further directions for testing. Return precautions given.  Case discussed with Dr Tracie Harrier, who agrees to plan.  Final Clinical Impressions(s) / UC Diagnoses   Final diagnoses:  Viral upper respiratory tract infection    ED Prescriptions    Medication Sig Dispense Auth. Provider   fluticasone (FLONASE) 50 MCG/ACT nasal spray Place 2 sprays into both nostrils daily. 1 g Derek Attar V, PA-C   ipratropium (ATROVENT) 0.06 % nasal spray Place 2 sprays into both nostrils 4 (four) times daily. 15 mL Derek Rost V, PA-C   benzonatate (TESSALON) 100 MG capsule Take 1 capsule (100 mg total) by mouth every 8 (eight) hours. 21 capsule Derek Lindsey, Derek Lindsey 04/16/18 1148

## 2018-04-16 NOTE — Discharge Instructions (Addendum)
Your history and exam with lower suspicion for COVID at this time and does not meet testing requirements. You can take over the counter medicines to help with your symptoms. Tessalon for cough. I have also added flonase and atrovent for nasal congestion/drainage. I would recommend to self quarantine for the next few days while you are off work and check your temperature twice a day. If have a temperature over 100.4, to call COVID hotline for further directions for testing.

## 2018-04-16 NOTE — ED Triage Notes (Signed)
F[place] and [place] to [place] march 6 thru march 8  3/8 generalized weakness 3/11 sore throat, chest congestion, slight cough, malaise.    3/17 sob  Patient hasn't had chills or checked for fever.  Patient denies fever

## 2018-05-05 MED FILL — VENTOLIN HFA 90 MCG INHALER: 108 (90 BAS | 30 days supply | Qty: 18 | Fill #0

## 2018-05-05 MED FILL — BENZONATATE 100 MG CAP: 100 | 7 days supply | Qty: 20 | Fill #0

## 2018-07-17 IMAGING — US US RENAL
1 series · 14 of 25 positions shown · non-contrast
Comparison: CT abdomen pelvis 03/29/2011, 06/07/2008.

CLINICAL DATA: 26-year-old with acute onset of left flank pain.
Personal history of urinary tract calculi.

EXAM:
RENAL / URINARY TRACT ULTRASOUND COMPLETE

[Series 1: us renal · 0.23mm/px · 14 of 42 slices shown]
[im 1/42]
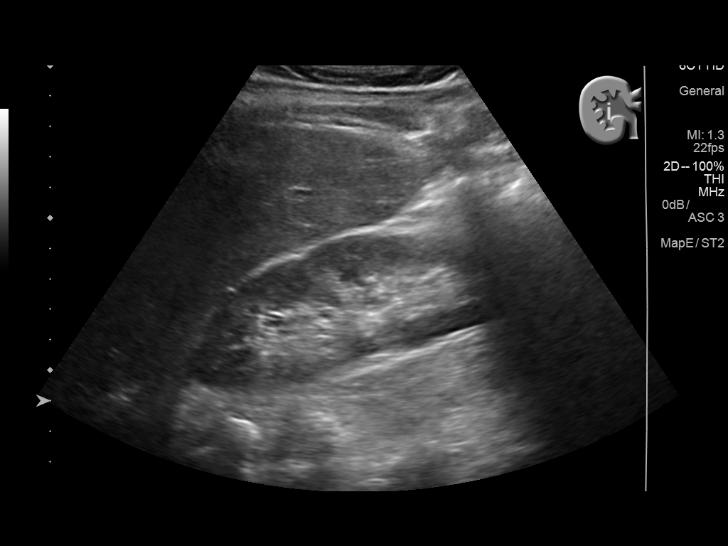
[im 4/42]
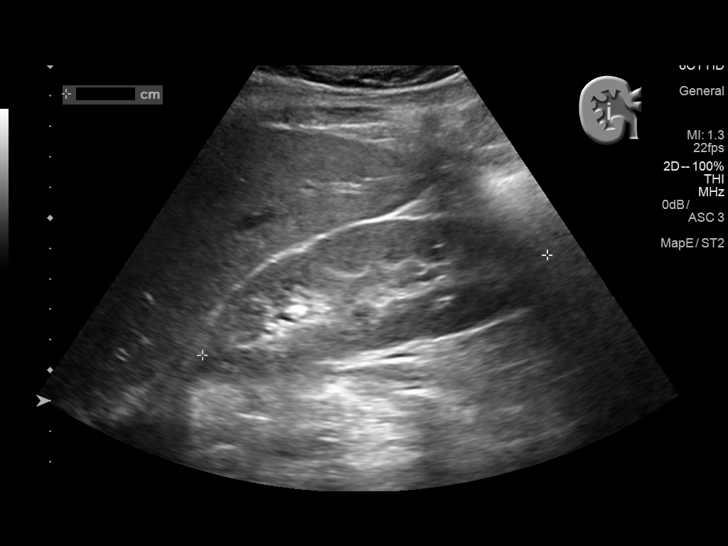
[im 7/42]
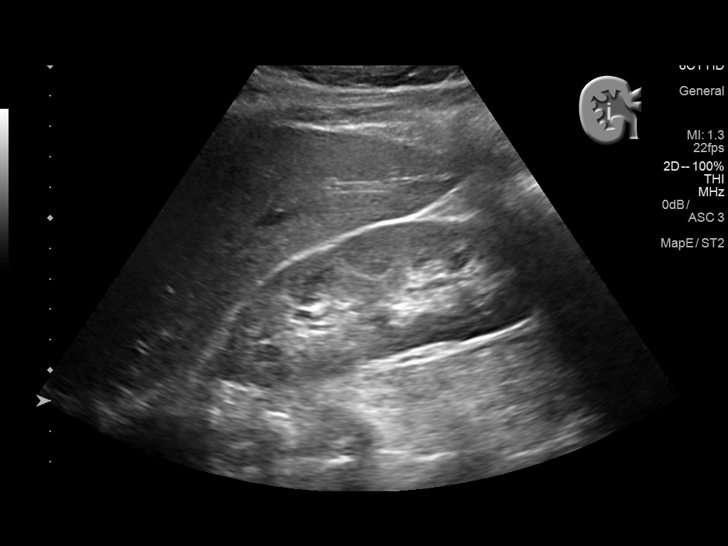
[im 11/42]
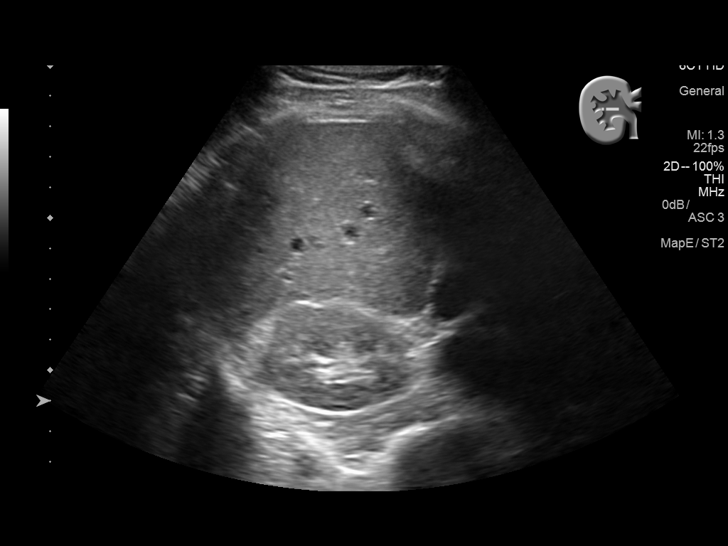
[im 14/42]
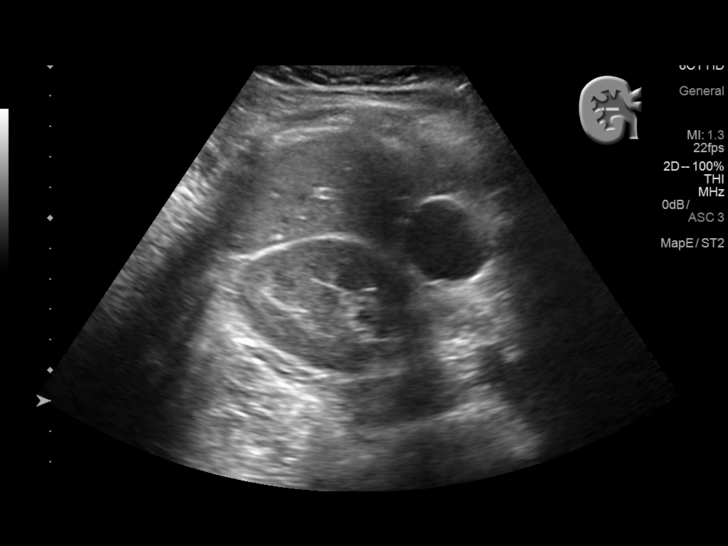
[im 16/42]
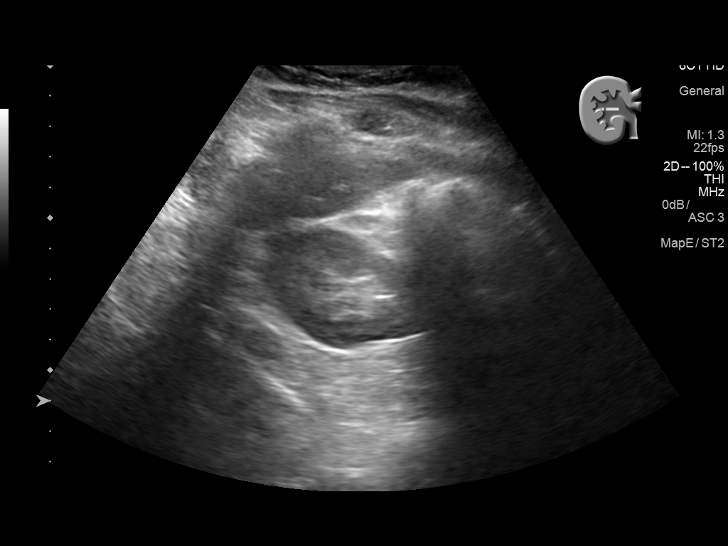
[im 19/42]
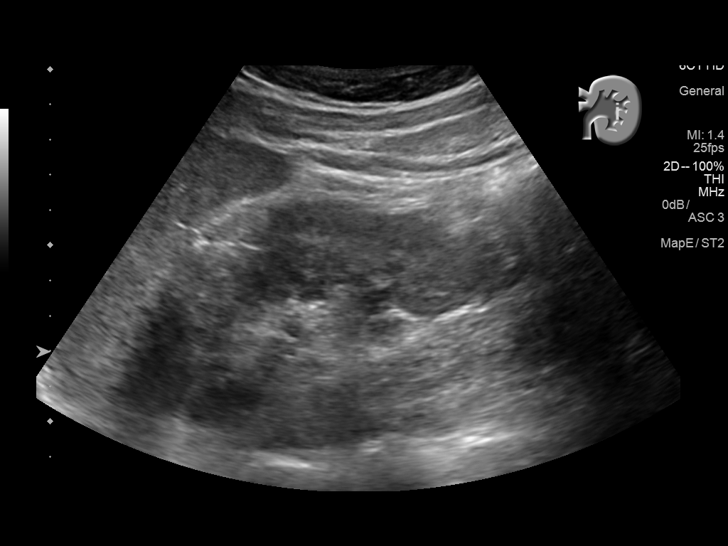
[im 23/42]
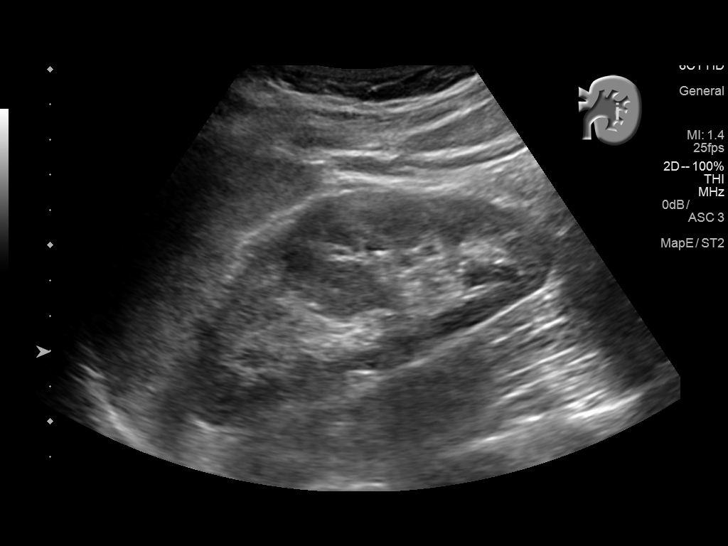
[im 26/42]
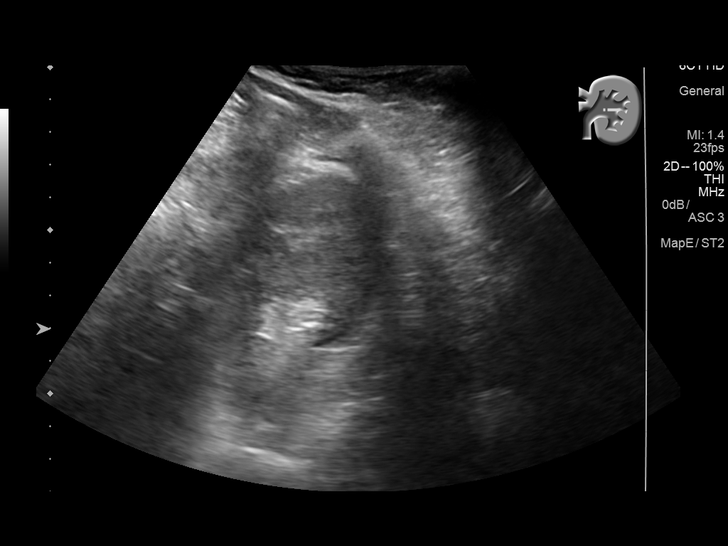
[im 28/42]
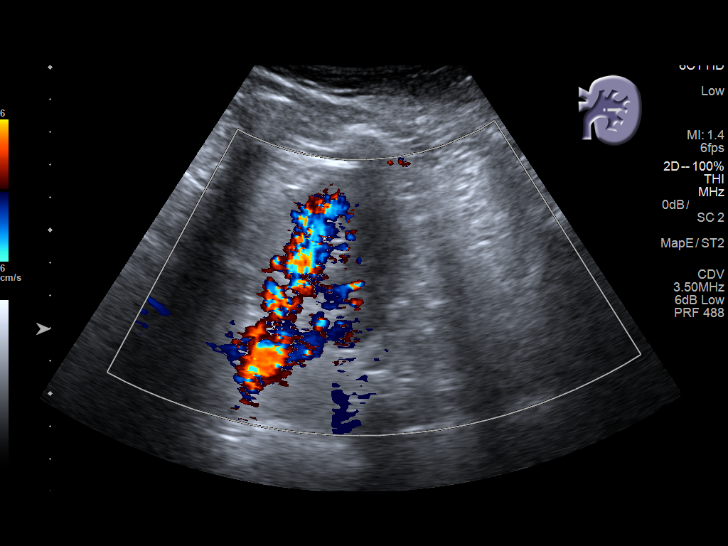
[im 31/42]
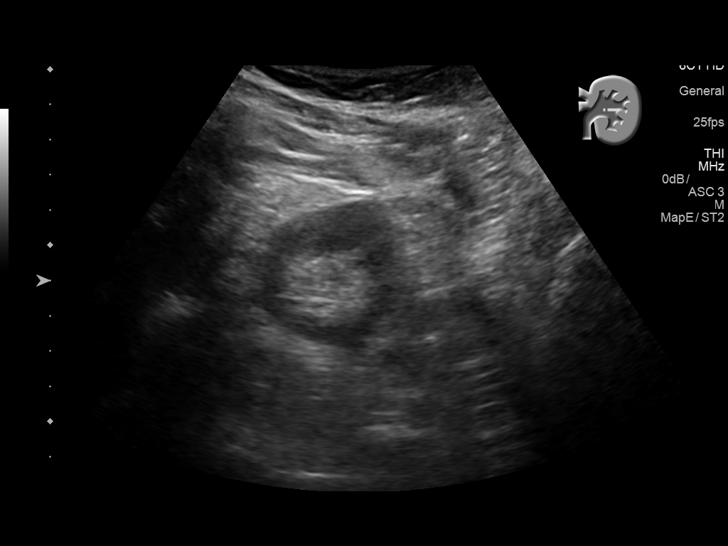
[im 35/42]
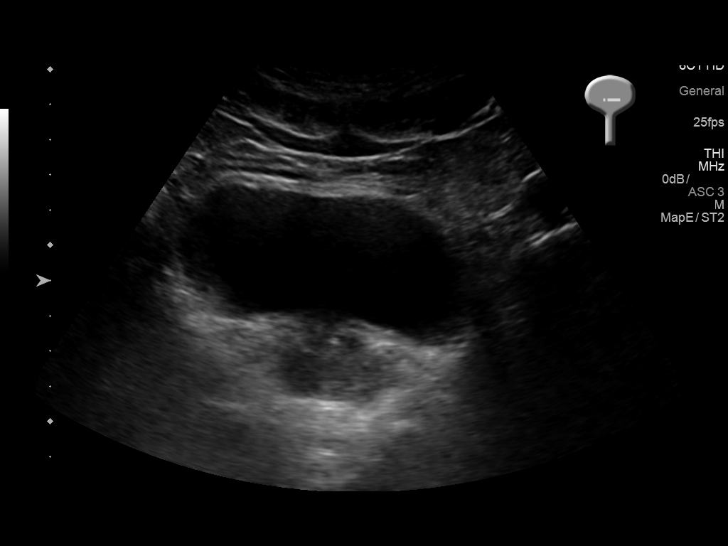
[im 38/42]
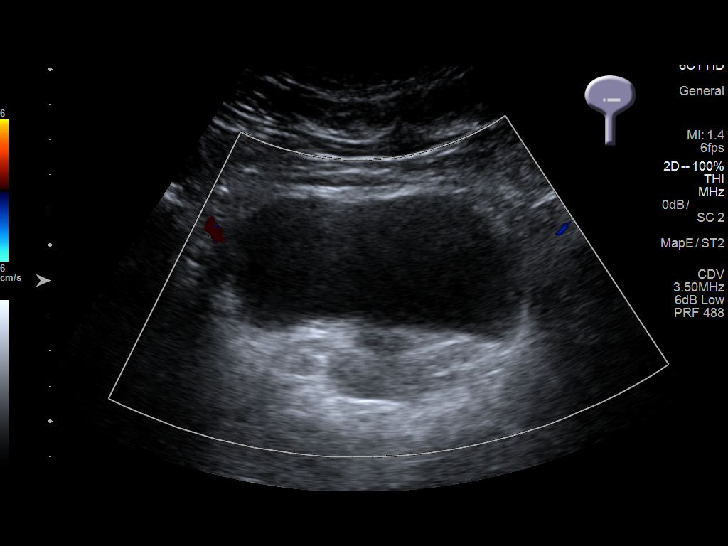
[im 42/42]
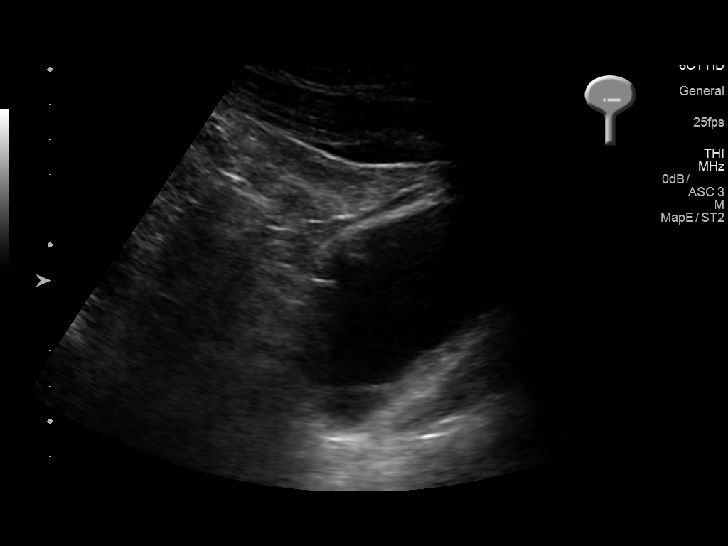

[14 of 25 positions shown; findings below may reference images not displayed]

FINDINGS: Right Kidney:

Length: Approximately 11.8 cm. No hydronephrosis. Extrarenal pelvis
as noted on the prior CTs. Well-preserved cortex. No shadowing
calculi. Normal parenchymal echotexture. No focal parenchymal
abnormality.

Left Kidney:

Length: Approximately 11.4 cm. No hydronephrosis. Well-preserved
cortex. No shadowing calculi. Normal parenchymal echotexture.
Prominent column of Bertin as noted on the prior CTs. No focal
parenchymal abnormality.

Bladder:

Normal for degree of bladder distention.
IMPRESSION: Normal examination. Specifically, no visible left urinary tract
calculi and no evidence of left urinary tract obstruction.

## 2018-08-04 MED FILL — AMPHETAMINE-DEXTROAMPHETAMI: 10 | 30 days supply | Qty: 30 | Fill #0

## 2018-08-04 MED FILL — ADDERALL XR 20 MG CAP SA: 20 | 30 days supply | Qty: 30 | Fill #0

## 2018-08-13 ENCOUNTER — Other Ambulatory Visit: Payer: Self-pay

## 2018-08-13 ENCOUNTER — Ambulatory Visit (INDEPENDENT_AMBULATORY_CARE_PROVIDER_SITE_OTHER): Payer: BC Managed Care – PPO | Admitting: Internal Medicine

## 2018-08-13 DIAGNOSIS — Z23 Encounter for immunization: Secondary | ICD-10-CM | POA: Diagnosis not present

## 2018-08-14 NOTE — Progress Notes (Signed)
I did not see or examine this patient. Patient came to [facility] for Hep A and Hep B vaccinations which were administered by RN.

## 2018-09-25 MED FILL — AMPHETAMINE-DEXTROAMPHETAMI: 10 | 30 days supply | Qty: 30 | Fill #0

## 2018-09-25 MED FILL — ADDERALL XR 20 MG CAP SA: 20 | 30 days supply | Qty: 30 | Fill #0

## 2018-11-07 ENCOUNTER — Ambulatory Visit: Payer: Self-pay | Admitting: Surgery

## 2018-11-07 NOTE — H&P (Signed)
History of Present Illness Derek Lindsey. Derek Nadal Lindsey; 11/07/2018 1:20 PM) The patient is a 28 year old male who presents with an inguinal hernia. 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 He was previously evaluated by Dr. Abbey Chatters who did not feel that he had a right inguinal hernia at that time. The patient is quite active and exercises vigorously. Recently he has noted more tenderness radiating down into the scrotum. He also noticed some slight asymmetry in the right groin with more fullness on that side. No obstructive symptoms. He is home for the weekend and comes in today for evaluation for possible inguinal hernia. He denies any symptoms on the left side.   Problem List/Past Medical Derek Lindsey K. Derek Yiu, Lindsey; 11/07/2018 1:20 PM) INGUINAL HERNIA OF RIGHT SIDE WITHOUT OBSTRUCTION OR GANGRENE (K40.90)  Diagnostic Studies History Derek Bickers, Lindsey; 72/05/3662 4:03 AM) Colonoscopy never  Allergies Derek Bickers, Lindsey; 47/04/2593 6:38 AM) No Known Drug Allergies [11/07/2018]: Allergies Reconciled  Medication History Derek Bickers, Lindsey; 75/06/4330 9:51 AM) Adderall XR (20MG  Capsule ER 24HR, Oral) Active. Medications Reconciled  Social History , Lindsey; Derek Lindsey 88/04/1658 AM) Alcohol use Occasional alcohol use. Caffeine use Coffee. No drug use Tobacco use Never smoker.     Review of Systems 6:30 Derek Lindsey; Derek Lindsey 16/0/1093 AM) General Not Present- Appetite Loss, Chills, Fatigue, Fever, Night Sweats, Weight Gain and Weight Loss. Skin Not Present- Change in Wart/Mole, Dryness, Hives, Jaundice, New Lesions, Non-Healing Wounds, Rash and Ulcer. HEENT Not Present- Earache, Hearing Loss, Hoarseness, Nose Bleed, Oral Ulcers, Ringing in the Ears, Seasonal Allergies, Sinus Pain, Sore Throat, Visual Disturbances, Wears glasses/contact lenses and Yellow  Eyes. Respiratory Not Present- Bloody sputum, Chronic Cough, Difficulty Breathing, Snoring and Wheezing. Breast Not Present- Breast Mass, Breast Pain, Nipple Discharge and Skin Changes. Male Genitourinary Not Present- Blood in Urine, Change in Urinary Stream, Frequency, Impotence, Nocturia, Painful Urination, Urgency and Urine Leakage. Musculoskeletal Not Present- Back Pain, Joint Pain, Joint Stiffness, Muscle Pain, Muscle Weakness and Swelling of Extremities. Neurological Not Present- Decreased Memory, Fainting, Headaches, Numbness, Seizures, Tingling, Tremor, Trouble walking and Weakness. Psychiatric Not Present- Anxiety, Bipolar, Change in Sleep Pattern, Depression, Fearful and Frequent crying. Endocrine Not Present- Cold Intolerance, Excessive Hunger, Hair Changes, Heat Intolerance, Hot flashes and New Diabetes. Hematology Not Present- Blood Thinners, Easy Bruising, Excessive bleeding, Gland problems, HIV and Persistent Infections.  Vitals 2:35 Derek Lindsey; Derek Lindsey 57/03/2200 AM) 11/07/2018 9:56 AM Weight: 160.8 lb Height: 71in Body Surface Area: 1.92 m Body Mass Index: 22.43 kg/m  Temp.: 97.64F(Temporal)  Pulse: 74 (Regular)  BP: 116/68 (Sitting, Left Arm, Standard)        Physical Exam 01/07/2019 Derek Lindsey; 11/07/2018 1:21 PM)  The physical exam findings are as follows: Note:WDWN in NAD Eyes: Pupils equal, round; sclera anicteric HENT: Oral mucosa moist; good dentition Neck: No masses palpated, no thyromegaly Lungs: CTA bilaterally; normal respiratory effort CV: Regular rate and rhythm; no murmurs; extremities well-perfused with no edema Abd: +bowel sounds, soft, non-tender, no palpable organomegaly; no palpable hernias GU; bilateral descended testes, no testicular masses. The right testicle rides slightly higher than the left. There is a small spontaneously reducible right inguinal hernia with Valsalva maneuver. No sign of left inguinal hernia. Slight tenderness on  palpation of the spermatic cord at the external ring. Skin: Warm, dry; no sign of jaundice Psychiatric - alert and oriented x 4; calm mood and  affect    Assessment & Plan Derek Lindsey. Derek Lindsey; 11/07/2018 1:21 PM)  INGUINAL HERNIA OF RIGHT SIDE WITHOUT OBSTRUCTION OR GANGRENE (K40.90)  Current Plans Schedule for Surgery - Right inguinal hernia repair with mesh. The surgical procedure has been discussed with the patient. Potential risks, benefits, alternative treatments, and expected outcomes have been explained. All of the patient's questions at this time have been answered. The likelihood of reaching the patient's treatment goal is good. The patient understand the proposed surgical procedure and wishes to proceed.  We will try to work around the patient's schedule with school. I told him that would be glad to do this surgery on a Friday afternoon after morning office.  Derek Lindsey. Derek Dover, Lindsey, [facility] Surgery  General/ Trauma Surgery Beeper [phone]  11/07/2018 1:21 PM

## 2018-11-10 MED FILL — AMPHETAMINE-DEXTROAMPHETAMI: 10 | 30 days supply | Qty: 30 | Fill #0

## 2018-11-10 MED FILL — ADDERALL XR 20 MG CAP SA: 20 | 30 days supply | Qty: 30 | Fill #0

## 2019-03-25 MED FILL — AMPHETAMINE-DEXTROAMPHETAMI: 10 | 30 days supply | Qty: 30 | Fill #0

## 2019-03-25 MED FILL — ADDERALL XR 20 MG CAP SA: 20 | 30 days supply | Qty: 30 | Fill #0

## 2019-04-20 ENCOUNTER — Telehealth (HOSPITAL_COMMUNITY): Payer: Self-pay | Admitting: *Deleted

## 2019-04-20 ENCOUNTER — Telehealth (HOSPITAL_COMMUNITY): Payer: Self-pay | Admitting: Vascular Surgery

## 2019-04-20 DIAGNOSIS — R55 Syncope and collapse: Secondary | ICD-10-CM

## 2019-04-20 NOTE — Telephone Encounter (Signed)
Per Dr Gala Romney pt needs echo and a f/u appt with him for presyncope.  Order placed will arrange.

## 2019-04-20 NOTE — Telephone Encounter (Signed)
Left pt message to make new pt appt w/ echo w/ db

## 2019-05-01 ENCOUNTER — Encounter (HOSPITAL_COMMUNITY): Payer: Self-pay | Admitting: Internal Medicine

## 2019-05-01 ENCOUNTER — Ambulatory Visit (HOSPITAL_COMMUNITY)
Admission: RE | Admit: 2019-05-01 | Discharge: 2019-05-01 | Disposition: A | Discharge status: Home / Self Care | Payer: BC Managed Care – PPO | Source: Ambulatory Visit | Attending: Internal Medicine | Admitting: Internal Medicine

## 2019-05-01 ENCOUNTER — Ambulatory Visit (HOSPITAL_BASED_OUTPATIENT_CLINIC_OR_DEPARTMENT_OTHER)
Admission: RE | Admit: 2019-05-01 | Discharge: 2019-05-01 | Disposition: A | Discharge status: Home / Self Care | Payer: BC Managed Care – PPO | Source: Ambulatory Visit | Attending: Internal Medicine | Admitting: Internal Medicine

## 2019-05-01 ENCOUNTER — Other Ambulatory Visit: Payer: Self-pay

## 2019-05-01 VITALS — BP 118/74 | HR 73 | Ht 71.0 in | Wt 157.8 lb

## 2019-05-01 DIAGNOSIS — R42 Dizziness and giddiness: Secondary | ICD-10-CM | POA: Diagnosis present

## 2019-05-01 DIAGNOSIS — F988 Other specified behavioral and emotional disorders with onset usually occurring in childhood and adolescence: Secondary | ICD-10-CM | POA: Insufficient documentation

## 2019-05-01 DIAGNOSIS — R55 Syncope and collapse: Secondary | ICD-10-CM | POA: Insufficient documentation

## 2019-05-01 DIAGNOSIS — R079 Chest pain, unspecified: Secondary | ICD-10-CM | POA: Diagnosis not present

## 2019-05-01 DIAGNOSIS — R0609 Other forms of dyspnea: Secondary | ICD-10-CM

## 2019-05-01 DIAGNOSIS — R0602 Shortness of breath: Secondary | ICD-10-CM | POA: Diagnosis not present

## 2019-05-01 DIAGNOSIS — R0600 Dyspnea, unspecified: Secondary | ICD-10-CM | POA: Diagnosis not present

## 2019-05-01 LAB — COMPREHENSIVE METABOLIC PANEL
ALT: 22 U/L (ref 0–44)
AST: 19 U/L (ref 15–41)
Albumin: 4.2 g/dL (ref 3.5–5.0)
Alkaline Phosphatase: 39 U/L (ref 38–126)
Anion gap: 8 (ref 5–15)
BUN: 16 mg/dL (ref 6–20)
CO2: 27 mmol/L (ref 22–32)
Calcium: 9.5 mg/dL (ref 8.9–10.3)
Chloride: 106 mmol/L (ref 98–111)
Creatinine, Ser: 0.91 mg/dL (ref 0.61–1.24)
GFR calc Af Amer: >60 mL/min (ref >60)
GFR calc non Af Amer: >60 mL/min (ref >60)
Glucose, Bld: 99 mg/dL (ref 70–99)
Potassium: 5.2 mmol/L — ABNORMAL HIGH (ref 3.5–5.1)
Sodium: 141 mmol/L (ref 135–145)
Total Bilirubin: 0.2 mg/dL — ABNORMAL LOW (ref 0.3–1.2)
Total Protein: 6.5 g/dL (ref 6.5–8.1)

## 2019-05-01 LAB — TSH: TSH: 1.681 u[IU]/mL (ref 0.350–4.500)

## 2019-05-01 LAB — CBC
HCT: 45.7 % (ref 39.0–52.0)
Hemoglobin: 14.8 g/dL (ref 13.0–17.0)
MCH: 29.7 pg (ref 26.0–34.0)
MCHC: 32.4 g/dL (ref 30.0–36.0)
MCV: 91.6 fL (ref 80.0–100.0)
Platelets: 177 10*3/uL (ref 150–400)
RBC: 4.99 MIL/uL (ref 4.22–5.81)
RDW: 13.2 % (ref 11.5–15.5)
WBC: 5.6 10*3/uL (ref 4.0–10.5)
nRBC: 0 % (ref 0.0–0.2)

## 2019-05-01 NOTE — Patient Instructions (Signed)
Labs done today, your results will be available in MyChart, we will contact you for abnormal readings.  Your physician has recommended that you have a cardiopulmonary stress test (CPX). CPX testing is a non-invasive measurement of heart and lung function. It replaces a traditional treadmill stress test. This type of test provides a tremendous amount of information that relates not only to your present condition but also for future outcomes. This test combines measurements of you ventilation, respiratory gas exchange in the lungs, electrocardiogram (EKG), blood pressure and physical response before, during, and following an exercise protocol.

## 2019-05-01 NOTE — Progress Notes (Signed)
2D Echocardiogram has been performed.  Derek Lindsey 05/01/2019, 9:54 AM

## 2019-05-01 NOTE — Progress Notes (Addendum)
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 Presents for further evaluation of syncope.   No h/o heart disease. 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 In high school was having exertional CP and palpitations. Had echo, cpx and Holter which were ok.   Continues to workout regularly with running and lifting weights. Remains very fit. Says over the past few months notices that when he doesn't take his Adderall he has to stop due to shortness of breath and lightheadedness/presyncope and tight pain in chest. Can usually restart after a break. Can get HR up to 190.   No resting symptoms at all. Does not drink a lot of caffeine. Hard to sleep at times. Not taking additional adderall    Review of Systems: [y] = yes, [ ]  = no   General: Weight gain [ ] ; Weight loss [ ] ; Anorexia [ ] ; Fatigue [ ] ; Fever [ ] ; Chills [ ] ; Weakness [ ]   Cardiac: Chest pain/pressure ]; Resting SOB [ ] ; Exertional SOB [y]; Orthopnea [ ] ; Pedal Edema [ ] ; Palpitations ]; Syncope [ ] ; Presyncope [ ] ; Paroxysmal nocturnal dyspnea[ ]   Pulmonary: Cough [ ] ; Wheezing[ ] ; Hemoptysis[ ] ; Sputum [ ] ; Snoring [ ]   GI: Vomiting[ ] ; Dysphagia[ ] ; Melena[ ] ; Hematochezia [ ] ; Heartburn[ ] ; Abdominal pain [ ] ; Constipation [ ] ; Diarrhea [ ] ; BRBPR [ ]   GU: Hematuria[ ] ; Dysuria [ ] ; Nocturia[ ]   Vascular: Pain in legs with walking [ ] ; Pain in feet with lying flat [ ] ; Non-healing sores [ ] ; Stroke [ ] ; TIA [ ] ; Slurred speech [ ] ;  Neuro: Headaches[ ] ; Vertigo[ ] ; Seizures[ ] ; Paresthesias[ ] ;Blurred vision [ ] ; Diplopia [ ] ; Vision changes [ ]   Ortho/Skin: Arthritis [ ] ; Joint pain [ ] ; Muscle pain [ ] ; Joint swelling [ ] ; Back Pain [ ] ; Rash [ ]   Psych: Depression[ ] ; Anxiety[y ]  Heme: Bleeding problems [ ] ; Clotting disorders [ ] ; Anemia [ ]   Endocrine: Diabetes [ ] ; Thyroid dysfunction[ ]    Past  Medical History:  Diagnosis Date  . ADD (attention deficit disorder with hyperactivity)   . Kidney calculi     Current Outpatient Medications  Medication Sig Dispense Refill  . ADDERALL XR 20 MG 24 hr capsule     . amphetamine-dextroamphetamine (ADDERALL) 10 MG tablet Take 10 mg by mouth daily as needed (ADHD).      No current facility-administered medications for this encounter.    Allergies  Allergen Reactions  . Avocado Anaphylaxis    Brakes out in hives and SOB  . Kiwi Extract Anaphylaxis    Brakes out in hives and SOB  . Justicia Adhatoda (Malabar Nut Tree)  [Justicia Adhatoda]       Social History   Socioeconomic History  . Marital status: Single    Spouse name: Not on file  . Number of children: Not on file  . Years of education: Not on file  . Highest education level: Not on file  Occupational History  . Not on file  Tobacco Use  . Smoking status: Never Smoker  . Smokeless tobacco: Never Used  Substance and Sexual Activity  . Alcohol use: Yes    Alcohol/week: 5.0 standard drinks    Types: 5 Cans of beer per week    Comment: weekly  . Drug use: No  . Sexual  activity: Yes    Partners: Female    Birth control/protection: Condom  Other Topics Concern  . Not on file  Social History Narrative  . Not on file   Social Determinants of Health   Financial Resource Strain:   . Difficulty of Paying Living Expenses:   Food Insecurity:   . Worried About Charity fundraiser in the Last Year:   . Arboriculturist in the Last Year:   Transportation Needs:   . Film/video editor (Medical):   Marland Kitchen Lack of Transportation (Non-Medical):   Physical Activity:   . Days of Exercise per Week:   . Minutes of Exercise per Session:   Stress:   . Feeling of Stress :   Social Connections:   . Frequency of Communication with Friends and Family:   . Frequency of Social Gatherings with Friends and Family:   . Attends Religious Services:   . Active Member of Clubs or  Organizations:   . Attends Archivist Meetings:   Marland Kitchen Marital Status:   Intimate Partner Violence:   . Fear of Current or Ex-Partner:   . Emotionally Abused:   Marland Kitchen Physically Abused:   . Sexually Abused:       Family History  Problem Relation Age of Onset  . Cancer Mother        lung  . Anxiety disorder Father   . Depression Father   . Cancer Maternal Grandfather        lung  . Anxiety disorder Sister   . Depression Sister   . Dementia Maternal Grandmother   . Alcohol abuse Paternal Grandfather     Vitals:   05/01/19 0950  BP: 118/74  Pulse: 73  SpO2: 97%  Weight: 71.6 kg (157 lb 12.8 oz)  Height: 5\' 11"  (1.803 m)    PHYSICAL EXAM: General:  Well/fit appearing. No respiratory difficulty HEENT: normal Neck: supple. no JVD. Carotids 2+ bilat; no bruits. No lymphadenopathy or thryomegaly appreciated. Cor: PMI nondisplaced. Regular. Bradycardic. No rubs, gallops or murmurs. Lungs: clear Abdomen: soft, nontender, nondistended. No hepatosplenomegaly. No bruits or masses. Good bowel sounds. Extremities: no cyanosis, clubbing, rash, edema Neuro: alert & oriented x 3, cranial nerves grossly intact. moves all 4 extremities w/o difficulty. Affect pleasant. Mildly pressured speech  ECG: NSR 61 early repol QTc 41ms Personally reviewed   ASSESSMENT & PLAN:  1. Exertional dyspnea, SOB and chest pain - echo and ECG reviewed normally - suspect related to exceeding his lactate threshold and HR threshold - suggested HR monitoring during exercise and keep peak HR < 180 - Plan CPX to further evaluate.  - Check basic labs   2. ADD - on Adderall. Appears stable  Glori Bickers, MD  11:28 AM

## 2019-05-05 ENCOUNTER — Other Ambulatory Visit: Payer: Self-pay

## 2019-05-05 ENCOUNTER — Encounter (HOSPITAL_COMMUNITY): Payer: Self-pay | Admitting: Licensed Clinical Social Worker

## 2019-05-05 ENCOUNTER — Ambulatory Visit (INDEPENDENT_AMBULATORY_CARE_PROVIDER_SITE_OTHER): Payer: BC Managed Care – PPO | Admitting: Licensed Clinical Social Worker

## 2019-05-05 DIAGNOSIS — F4323 Adjustment disorder with mixed anxiety and depressed mood: Secondary | ICD-10-CM | POA: Diagnosis not present

## 2019-05-05 NOTE — Progress Notes (Signed)
Comprehensive Clinical Assessment (CCA) Note  05/05/2019 ALPHEUS STIFF [id number]  Visit Diagnosis:      ICD-10-CM   1. Adjustment disorder with mixed anxiety and depressed mood  F43.23       CCA Part One  Part One has been completed on paper by the patient.  (See scanned document in Chart Review)  CCA Part Two A  Intake/Chief Complaint:  CCA Intake With Chief Complaint CCA Part Two Date: 05/05/19 CCA Part Two Time: 1324 Chief Complaint/Presenting Problem: Patient continues to have adjustment disorder with mixed anxiety and depressed mood. Patient is currently in [employer] school. 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 witnessed a suicide)  Obsessions:  Obsessions: N/A  Compulsions:  Compulsions: N/A  Inattention:  Inattention: Does not seem to listen, Avoids/dislikes activities that require focus  Hyperactivity/Impulsivity:  Hyperactivity/Impulsivity: N/A  Oppositional/Defiant Behaviors:  Oppositional/Defiant Behaviors: N/A  Borderline Personality:  Emotional Irregularity: N/A  Other Mood/Personality Symptoms:      Mental Status Exam Appearance and self-care  Stature:  Stature: Average  Weight:  Weight: Average weight  Clothing:  Clothing: Casual  Grooming:  Grooming: Normal  Cosmetic use:   Cosmetic Use: None  Posture/gait:  Posture/Gait: Normal  Motor activity:  Motor Activity: Not Remarkable  Sensorium  Attention:  Attention: Normal  Concentration:  Concentration: Anxiety interferes  Orientation:  Orientation: X5  Recall/memory:  Recall/Memory: Normal  Affect and Mood  Affect:  Affect: Anxious  Mood:     Relating  Eye contact:  Eye Contact: Normal  Facial expression:  Facial Expression: Anxious  Attitude toward examiner:  Attitude Toward Examiner: Cooperative  Thought and Language  Speech flow: Speech Flow: Normal  Thought content:  Thought Content: Appropriate to mood and circumstances  Preoccupation:     Hallucinations:     Organization:     Transport planner of Knowledge:  Fund of Knowledge: Average  Intelligence:  Intelligence: Above Average  Abstraction:  Abstraction: Normal  Judgement:  Judgement: Fair  Art therapist:  Reality Testing: Realistic  Insight:  Insight: Good  Decision Making:  Decision Making: Impulsive  Social Functioning  Social Maturity:  Social Maturity: Impulsive  Social Judgement:  Social Judgement: Normal  Stress  Stressors:  Stressors: Family conflict, Transitions  Coping Ability:  Coping Ability: Deficient supports  Skill Deficits:     Supports:      Family and Psychosocial History: Family history Marital status: Single Does patient have children?: No  Childhood History:  Childhood History By whom was/is the patient raised?: Both parents Additional childhood history information: Raised by both parents, mother returned to NP school while patient was in college, pt felt abandoned. I was loved and cared for, however they never let me or my siblings fail Description of patient's relationship with caregiver when they were a child: good I guess Patient's description of current relationship with people who raised him/her: has worked on relationship with parents as an adult How were you disciplined when  you got in trouble  as a child/adolescent?: spankings Does patient have siblings?: Yes Number of Siblings: 2 Description of patient's current relationship with siblings: good relationship with younger brother whos in Futures trader, i'm getting closer to my older sister Did patient suffer any verbal/emotional/physical/sexual abuse as a child?: No Did patient suffer from severe childhood neglect?: No Has patient ever been sexually abused/assaulted/raped as an adolescent or adult?: No Was the patient ever a victim of a crime or a disaster?: No Witnessed domestic violence?: No Has patient been effected by domestic violence as an adult?: No  CCA Part Two B  Employment/Work Situation: Employment / Work Psychologist, occupational Employment situation: Surveyor, minerals job has been impacted by current illness: No What is the longest time patient has a held a job?: 3 years 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?: No Are There Guns or Other Weapons in Your Home?: No  Education: Engineer, civil (consulting) Currently Attending: [employer] Last Grade Completed: 17 Did Garment/textile technologist From [employer]?: Yes Did Theme park manager?: Yes What Type of College Degree Do you Have?: psychology Did You Attend Graduate School?: Yes What is Your Geophysicist/field seismologist Degree?: attending PA school currently Did You Have An Individualized Education Program (IIEP): No Did You Have Any Difficulty At School?: No  Religion: Religion/Spirituality Are You A Religious Person?: No  Leisure/Recreation: Leisure / Recreation Leisure and Hobbies: play soccer, workout  Exercise/Diet: Exercise/Diet Do You Exercise?: Yes What Type of Exercise Do You Do?: Run/Walk How Many Times a Week Do You Exercise?: 6-7 times a week Have You Gained or Lost A Significant Amount of Weight in the Past Six Months?: No Do You Follow a Special Diet?: No Do You Have Any Trouble  Sleeping?: Yes Explanation of Sleeping Difficulties: winding down to get to sleep  CCA Part Two C  Alcohol/Drug Use: Alcohol / Drug Use History of alcohol / drug use?: Yes Longest period of sobriety (when/how long): previous problematic alcohol problem Negative Consequences of Use: Personal relationships                      CCA Part Three  ASAM's:  Six Dimensions of Multidimensional Assessment  Dimension 1:  Acute Intoxication and/or Withdrawal Potential:     Dimension 2:  Biomedical Conditions and Complications:     Dimension 3:  Emotional, Behavioral, or Cognitive Conditions and Complications:     Dimension 4:  Readiness to Change:     Dimension 5:  Relapse, Continued use, or Continued Problem Potential:     Dimension 6:  Recovery/Living Environment:      Substance use Disorder (SUD)    Social Function:  Social Functioning Social Maturity: Impulsive Social Judgement: Normal  Stress:  Stress Stressors: Family conflict, Transitions Coping Ability: Deficient supports Patient Takes Medications The Way The Doctor Instructed?: Yes Priority Risk: Low Acuity  Risk Assessment- Self-Harm Potential: Risk Assessment For Self-Harm Potential Thoughts of Self-Harm: No current thoughts Method: No plan Availability of Means: No access/NA  Risk Assessment -Dangerous to Others Potential: Risk Assessment For Dangerous to Others Potential Method: No Plan Availability of Means: No access or NA Intent: Vague intent or NA Notification Required: No need or identified person  DSM5 Diagnoses: Patient Active Problem List   Diagnosis Date Noted  . Alcohol dependence (HCC) 07/28/2015  . Alcohol use disorder, severe, in early remission (HCC) 07/28/2015  . Groin strain-right 08/15/2012  .  CHEST PAIN 04/20/2009    Patient Centered Plan: Patient is on the following Treatment Plan(s):  Anxiety and depression  Recommendations for Services/Supports/Treatments: Recommendations for  Services/Supports/Treatments Recommendations For Services/Supports/Treatments: Individual Therapy  Treatment Plan Summary: OP Treatment Plan Summary:  I want to learn to adjust to new situations without anxiety or depression  Referrals to Alternative Service(s): Referred to Alternative Service(s):   Place:   Date:   Time:    Referred to Alternative Service(s):   Place:   Date:   Time:    Referred to Alternative Service(s):   Place:   Date:   Time:    Referred to Alternative Service(s):   Place:   Date:   Time:     Vernona Rieger

## 2019-05-11 ENCOUNTER — Other Ambulatory Visit (HOSPITAL_COMMUNITY): Payer: Self-pay | Admitting: Internal Medicine

## 2019-05-11 ENCOUNTER — Other Ambulatory Visit: Payer: Self-pay

## 2019-05-11 ENCOUNTER — Ambulatory Visit (HOSPITAL_COMMUNITY): Payer: BC Managed Care – PPO | Attending: Cardiology

## 2019-05-11 DIAGNOSIS — R079 Chest pain, unspecified: Secondary | ICD-10-CM | POA: Diagnosis not present

## 2019-05-11 DIAGNOSIS — R0600 Dyspnea, unspecified: Secondary | ICD-10-CM

## 2019-05-14 MED FILL — ADDERALL XR 20 MG CAP SA: 20 | 30 days supply | Qty: 30 | Fill #0

## 2019-05-14 MED FILL — AMPHETAMINE-DEXTROAMPHETAMI: 10 | 30 days supply | Qty: 30 | Fill #0

## 2019-05-15 ENCOUNTER — Ambulatory Visit: Payer: Self-pay | Admitting: Surgery

## 2019-05-18 ENCOUNTER — Encounter (HOSPITAL_COMMUNITY): Payer: Self-pay | Admitting: Licensed Clinical Social Worker

## 2019-05-18 ENCOUNTER — Ambulatory Visit (INDEPENDENT_AMBULATORY_CARE_PROVIDER_SITE_OTHER): Payer: BC Managed Care – PPO | Admitting: Licensed Clinical Social Worker

## 2019-05-18 ENCOUNTER — Other Ambulatory Visit: Payer: Self-pay

## 2019-05-18 DIAGNOSIS — F4323 Adjustment disorder with mixed anxiety and depressed mood: Secondary | ICD-10-CM

## 2019-05-18 NOTE — Progress Notes (Signed)
Virtual Visit via Video Note  I connected with Derek Lindsey on 05/18/19 at  4:00 PM EDT by a video enabled telemedicine application and verified that I am speaking with the correct person using two identifiers.   I discussed the limitations of evaluation and management by telemedicine and the availability of in person appointments. The patient expressed understanding and agreed to proceed.  History of Present Illness: Patient is self referred in his return to therapy. He continues to have adjustment issues, now in [employer] school.     Observations/Objective: Patient presented depressed for his initial counseling session in his return to therapy. Patient discussed his psychiatric symptoms and current life events. 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 Clinician utilized MI OARS to affirm his concerns. Clinician challenged his thoughts about this. Clinician processed options for communicating his concerns   Assessment and Plan: : Counselor will continue to meet with patient to address treatment plan goals. Patient will continue to follow recommendations of providers and implement skills learned in session.     Follow Up Instructions: I discussed the assessment and treatment plan with the patient. The patient was provided an opportunity to ask questions and all were answered. The patient agreed with the plan and demonstrated an understanding of the instructions.   The patient was advised to call back or seek an in-person evaluation if the symptoms worsen or if the condition fails to improve as anticipated.  I provided 30 minutes of non-face-to-face time during this encounter.   Derek Lindsey S, LCAS

## 2019-06-01 ENCOUNTER — Encounter (HOSPITAL_COMMUNITY): Payer: Self-pay | Admitting: Licensed Clinical Social Worker

## 2019-06-01 ENCOUNTER — Other Ambulatory Visit: Payer: Self-pay

## 2019-06-01 ENCOUNTER — Ambulatory Visit (INDEPENDENT_AMBULATORY_CARE_PROVIDER_SITE_OTHER): Payer: BC Managed Care – PPO | Admitting: Licensed Clinical Social Worker

## 2019-06-01 DIAGNOSIS — F4323 Adjustment disorder with mixed anxiety and depressed mood: Secondary | ICD-10-CM

## 2019-06-01 NOTE — Progress Notes (Signed)
Virtual Visit via Video Note  I connected with Derek Lindsey on 06/01/19 at  4:00 PM EDT by a video enabled telemedicine application and verified that I am speaking with the correct person using two identifiers.   I discussed the limitations of evaluation and management by telemedicine and the availability of in person appointments. The patient expressed understanding and agreed to proceed.  History of Present Illness: Patient is self referred in his return to therapy. He continues to have adjustment issues, now in [employer] school.     Observations/Objective: Patient presented depressed for his counseling session  Patient discussed his psychiatric symptoms and current life events. 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 Assisted patient identifying his current stressors and coping skills used. Counselor provided psychoeducation on stress as feeling tense, overwhelmed, worn out, and/or exhausted.until the stress becomes too overwhelming to manage.       Assessment and Plan: : Counselor will continue to meet with patient to address treatment plan goals. Patient will continue to follow recommendations of providers and implement skills learned in session.     Follow Up Instructions: I discussed the assessment and treatment plan with the patient. The patient was provided an opportunity to ask questions and all were answered. The patient agreed with the plan and demonstrated an understanding of the instructions.   The patient was advised to call back or seek an in-person evaluation if the symptoms worsen or if the condition fails to improve as anticipated.  I provided 60 minutes of non-face-to-face time during this encounter.   Adaliah Hiegel S, LCAS

## 2019-06-09 ENCOUNTER — Other Ambulatory Visit (HOSPITAL_COMMUNITY)
Admission: RE | Admit: 2019-06-09 | Discharge: 2019-06-09 | Disposition: A | Discharge status: Home / Self Care | Payer: BC Managed Care – PPO | Source: Ambulatory Visit | Attending: Internal Medicine | Admitting: Internal Medicine

## 2019-06-09 ENCOUNTER — Other Ambulatory Visit: Payer: Self-pay

## 2019-06-09 ENCOUNTER — Encounter: Payer: Self-pay | Admitting: Internal Medicine

## 2019-06-09 ENCOUNTER — Ambulatory Visit (INDEPENDENT_AMBULATORY_CARE_PROVIDER_SITE_OTHER): Payer: BC Managed Care – PPO | Admitting: Internal Medicine

## 2019-06-09 VITALS — BP 109/62 | HR 59 | Temp 97.9°F | Ht 70.0 in | Wt 156.3 lb

## 2019-06-09 DIAGNOSIS — N410 Acute prostatitis: Secondary | ICD-10-CM | POA: Diagnosis not present

## 2019-06-09 LAB — POCT URINALYSIS DIPSTICK
Bilirubin, UA: NEGATIVE
Blood, UA: NEGATIVE
Glucose, UA: NEGATIVE
Ketones, UA: NEGATIVE
Leukocytes, UA: NEGATIVE
Nitrite, UA: NEGATIVE
Protein, UA: NEGATIVE
Spec Grav, UA: >=1.03 — AB (ref 1.010–1.025)
Urobilinogen, UA: 0.2 E.U./dL
pH, UA: 6.5 (ref 5.0–8.0)

## 2019-06-09 MED ORDER — SULFAMETHOXAZOLE-TRIMETHOPRIM 400-80 MG PO TABS: 2.0000 | ORAL_TABLET | Freq: Two times a day (BID) | ORAL | 0 refills | Status: AC

## 2019-06-09 NOTE — Progress Notes (Signed)
  Subjective:  HPI: Derek Lindsey is a 29 y.o. male who presents for perirectal discomfort, urinary symptoms.  He reports that about 2 weeks ago he first noticed some discomfort sitting.  Since that time he has appreciated some urinary symptoms which has included dribbling and sensation that he did not completely urinate.  He has never had these symptoms before.  The perirectal discomfort is mainly while sitting.  He denies any red or firm areas around his buttocks.  He has not had clear dysuria he did have some pain at the tip of his penis.  He reports he has not been sexually active in the last few months he was probably last screened for STDs 6 months to a year ago.  He has not had any fever or chills.  He did try taking a Flomax which did help his urinary symptoms.  However he did notice this morning he masturbated but did not ejaculate which is never happened before.  He did use OTC ibuprofen, which helped the pain at the tip of his penis.  Objective:  Physical Exam: Vitals:   06/09/19 1549  BP: 109/62  Pulse: (!) 59  Temp: 97.9 F (36.6 C)  TempSrc: Oral  SpO2: 100%  Weight: 156 lb 4.8 oz (70.9 kg)  Height: 5\' 10"  (1.778 m)   Body mass index is 22.43 kg/m. Physical Exam Vitals and nursing note reviewed.  Constitutional:      General: He is not in acute distress.    Appearance: Normal appearance. He is not ill-appearing.  Cardiovascular:     Rate and Rhythm: Normal rate and regular rhythm.  Genitourinary:    Comments: Patient declined rectal/prostate exam, we discussed the recommendation for evaluation of prostatitis which he understands. Neurological:     Mental Status: He is alert.  Psychiatric:        Mood and Affect: Mood normal.        Behavior: Behavior normal.        Thought Content: Thought content normal.        Judgment: Judgment normal.    Assessment & Plan:  See Encounters Tab for problem based charting.  Medications Ordered Meds ordered  this encounter  Medications  . sulfamethoxazole-trimethoprim (BACTRIM) 400-80 MG tablet    Sig: Take 2 tablets by mouth 2 (two) times daily for 28 days.    Dispense:  112 tablet    Refill:  0   Other Orders Orders Placed This Encounter  Procedures  . Urinalysis, Reflex Microscopic  . POCT Urinalysis Dipstick (81002)   Follow Up: No follow-ups on file.

## 2019-06-09 NOTE — Progress Notes (Signed)
.  bnc

## 2019-06-10 DIAGNOSIS — N410 Acute prostatitis: Secondary | ICD-10-CM | POA: Insufficient documentation

## 2019-06-10 LAB — URINALYSIS, ROUTINE W REFLEX MICROSCOPIC
Bilirubin, UA: NEGATIVE
Glucose, UA: NEGATIVE
Ketones, UA: NEGATIVE
Leukocytes,UA: NEGATIVE
Nitrite, UA: NEGATIVE
Protein,UA: NEGATIVE
RBC, UA: NEGATIVE
Specific Gravity, UA: 1.021 (ref 1.005–1.030)
Urobilinogen, Ur: 0.2 mg/dL (ref 0.2–1.0)
pH, UA: 6.5 (ref 5.0–7.5)

## 2019-06-10 NOTE — Assessment & Plan Note (Signed)
His urinary complaints associated with perirectal complaints are concerning for acute prostatitis.  Fortunately he has no signs of systemic disease or illness.  I did discuss with him the recommendation for a rectal exam, if the prostate is tender this would grossly ensure the diagnosis however without it there is still some diagnostic uncertainty.  However acute prostatitis best explains his symptomatology.  I did obtain a urine dipstick which was grossly unremarkable, as well as a formal urinalysis which was also reassuring.  I did discuss with him testing for gonorrhea and chlamydia but he does not appear to be at elevated risk for these.  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  And risk of antibiotics.  We decided that we will go forward with a 4-week treatment course of Bactrim.  He will let me know if symptoms do not rapidly resolved within 3 to 5 days for further work-up.

## 2019-06-11 ENCOUNTER — Encounter: Payer: Self-pay | Admitting: Internal Medicine

## 2019-06-11 LAB — URINE CYTOLOGY ANCILLARY ONLY
Chlamydia: NEGATIVE
Comment: NEGATIVE
Comment: NORMAL
Neisseria Gonorrhea: NEGATIVE

## 2019-06-22 ENCOUNTER — Other Ambulatory Visit: Payer: Self-pay

## 2019-06-22 ENCOUNTER — Encounter (HOSPITAL_COMMUNITY): Payer: Self-pay | Admitting: Licensed Clinical Social Worker

## 2019-06-22 ENCOUNTER — Ambulatory Visit (INDEPENDENT_AMBULATORY_CARE_PROVIDER_SITE_OTHER): Payer: BC Managed Care – PPO | Admitting: Licensed Clinical Social Worker

## 2019-06-22 DIAGNOSIS — F1020 Alcohol dependence, uncomplicated: Secondary | ICD-10-CM

## 2019-06-22 DIAGNOSIS — F4323 Adjustment disorder with mixed anxiety and depressed mood: Secondary | ICD-10-CM
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
## 2019-07-08 ENCOUNTER — Other Ambulatory Visit: Payer: Self-pay

## 2019-07-08 ENCOUNTER — Ambulatory Visit (INDEPENDENT_AMBULATORY_CARE_PROVIDER_SITE_OTHER): Payer: BC Managed Care – PPO | Admitting: Licensed Clinical Social Worker

## 2019-07-08 DIAGNOSIS — F4323 Adjustment disorder with mixed anxiety and depressed mood: Secondary | ICD-10-CM | POA: Diagnosis not present

## 2019-07-08 DIAGNOSIS — F1020 Alcohol dependence, uncomplicated: Secondary | ICD-10-CM
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
## 2019-07-15 ENCOUNTER — Other Ambulatory Visit: Payer: Self-pay

## 2019-07-15 ENCOUNTER — Encounter (HOSPITAL_COMMUNITY): Payer: Self-pay | Admitting: Licensed Clinical Social Worker

## 2019-07-15 ENCOUNTER — Ambulatory Visit (INDEPENDENT_AMBULATORY_CARE_PROVIDER_SITE_OTHER): Payer: BC Managed Care – PPO | Admitting: Licensed Clinical Social Worker

## 2019-07-15 DIAGNOSIS — F4323 Adjustment disorder with mixed anxiety and depressed mood: Secondary | ICD-10-CM | POA: Diagnosis not present

## 2019-07-15 DIAGNOSIS — F1020 Alcohol dependence, uncomplicated: Secondary | ICD-10-CM

## 2019-07-15 NOTE — Progress Notes (Signed)
Virtual Visit via Video Note  I connected with Al Decant on 07/15/19 at  5:00 PM EDT by a video enabled telemedicine application and verified that I am speaking with the correct person using two identifiers.   I discussed the limitations of evaluation and management by telemedicine and the availability of in person appointments. The patient expressed understanding and agreed to proceed.  LOCATION: Patient: Home Provider: Home  History of Present Illness: Patient is self referred in his return to therapy. He continues to have adjustment issues, now in [employer].     Observations/Objective: Patient presented depressed for his counseling session  Patient discussed his psychiatric symptoms and current life events. Patient discussed his current stressor: personal relationship. "This relationship has been affecting me personally, school performance, other relationships, my health, my mental health."  Assessed patient's relationship and it's dynamics and discussed how the dynamics influence his symptoms in a negative way. Used CBT teaching him about the disorder and helping him deal with adjustment difficulties. Used CBT to help him cope with the relationship stressors through active problem solving.regulating his thoughts and moods.   Plan:  21 day gratitude journal,  Rewiring your brain."     PLAN: Low frustration tolerance, mindfulness skills, 3 AA meetings per week, 3 things gratitude daily, rewiring your brain book, gratitude journal  Assessment and Plan: : Counselor will continue to meet with patient to address treatment plan goals. Patient will continue to follow recommendations of providers and implement skills learned in session.     Follow Up Instructions: I discussed the assessment and treatment plan with the patient. The patient was provided an opportunity to ask questions and all were answered. The patient agreed with the plan and demonstrated an understanding of the  instructions.   The patient was advised to call back or seek an in-person evaluation if the symptoms worsen or if the condition fails to improve as anticipated.  I provided 60 minutes of non-face-to-face time during this encounter.   Telford Archambeau S, LCAS

## 2019-07-16 MED FILL — AMPHETAMINE-DEXTROAMPHETAMI: 10 | 30 days supply | Qty: 30 | Fill #0

## 2019-07-16 MED FILL — ADDERALL XR 20 MG CAP SA: 20 | 30 days supply | Qty: 30 | Fill #0

## 2019-08-12 ENCOUNTER — Other Ambulatory Visit: Payer: Self-pay

## 2019-08-12 ENCOUNTER — Ambulatory Visit (HOSPITAL_COMMUNITY): Payer: BC Managed Care – PPO | Admitting: Licensed Clinical Social Worker

## 2019-09-15 MED FILL — AMPHETAMINE SALTS 10 MG: 10 | 30 days supply | Qty: 30 | Fill #0

## 2019-09-15 MED FILL — ADDERALL XR 20 MG CAP SA: 20 | 30 days supply | Qty: 30 | Fill #0

## 2019-11-25 ENCOUNTER — Other Ambulatory Visit (HOSPITAL_COMMUNITY): Payer: Self-pay | Admitting: Psychiatry

## 2019-11-25 MED FILL — AMPHETAMINE SALTS 10 MG: 10 | 30 days supply | Qty: 30 | Fill #0

## 2019-12-16 IMAGING — CR DG ABDOMEN 1V
1 series · 1 of 1 positions shown · non-contrast
Comparison: 04/19/2011, 01/18/2010 and CT abdomen and pelvis
03/29/2011.

CLINICAL DATA: Acute onset of left flank pain that began earlier
today. Dysuria. Personal history of urinary tract calculi.

EXAM:
ABDOMEN - 1 VIEW

[t abdomen supine]
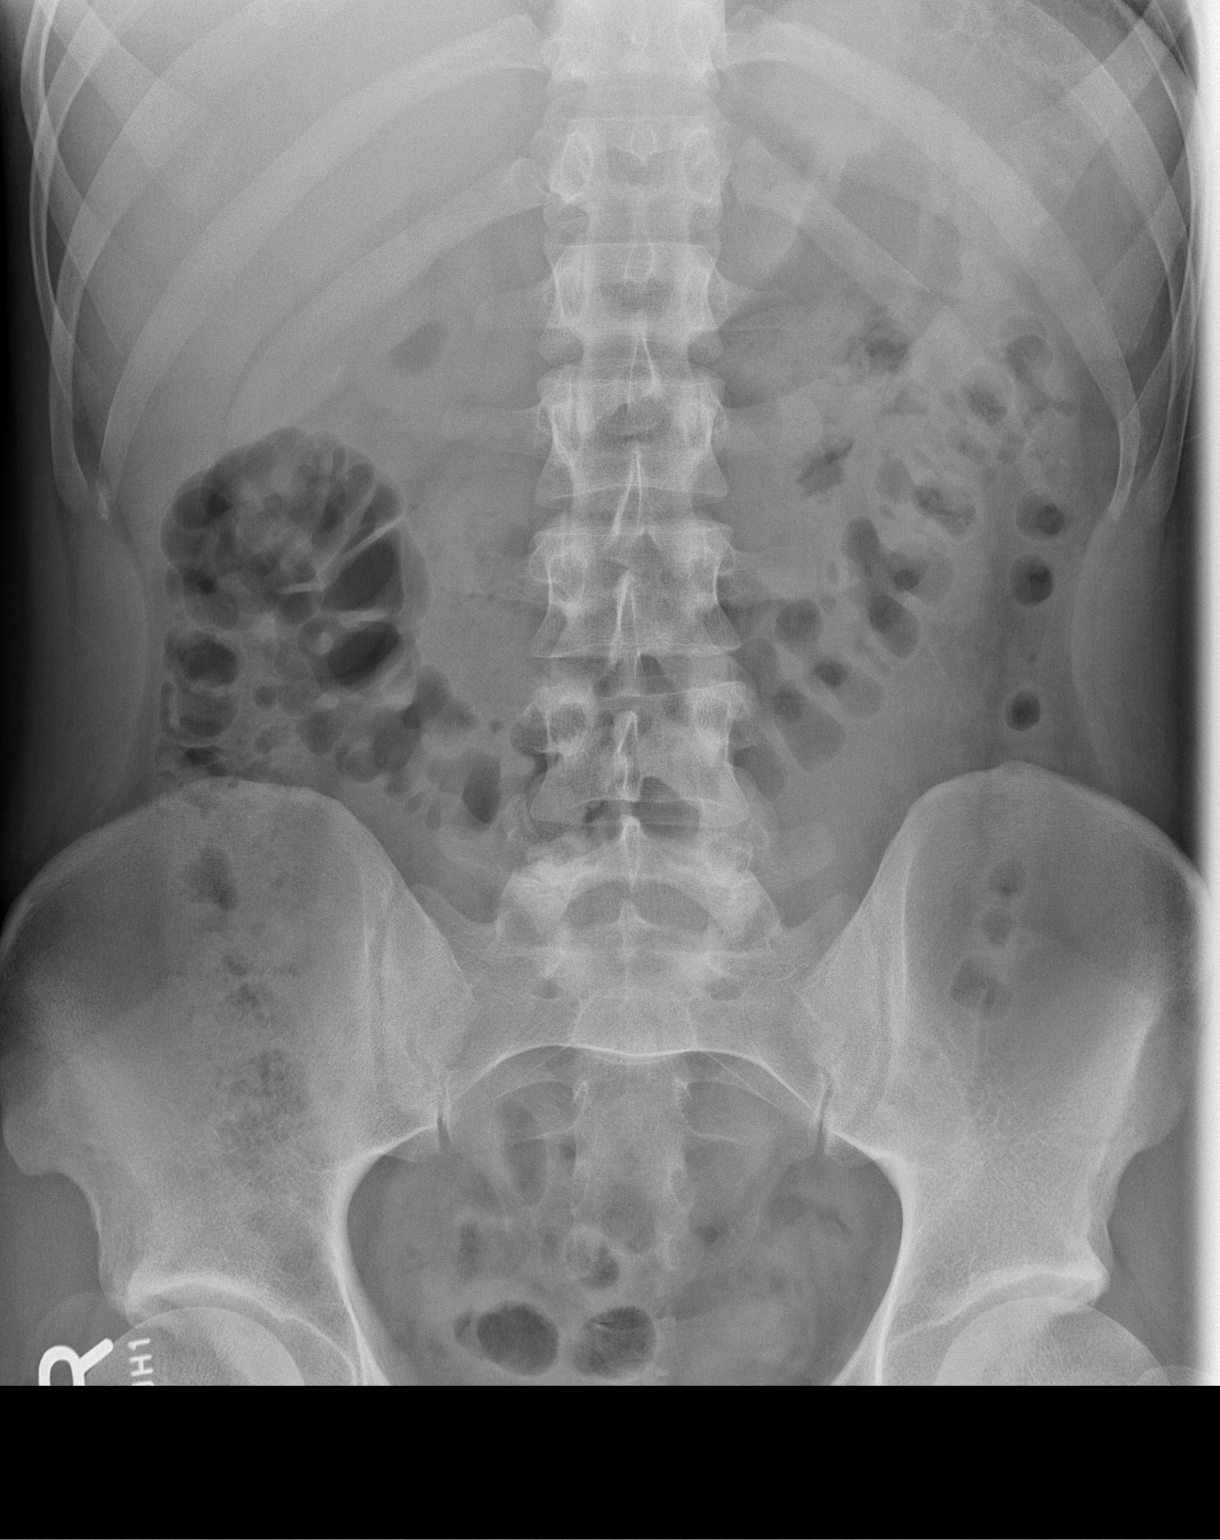

[1 of 1 positions shown; findings below may reference images not displayed]

FINDINGS: Oval-shaped calcification low in the left side of the pelvis at or
near the expected location of the left UVJ, not present on the prior
examinations. Calcification projected over the mid right kidney. No
evidence of opaque urinary tract calculi elsewhere. Bowel gas
pattern normal. Expected colonic stool burden.
IMPRESSION: 1. Probable left UPJ calculus.
2. Calculus involving the mid right kidney.
3. No acute abdominal abnormalities otherwise.

## 2019-12-29 ENCOUNTER — Other Ambulatory Visit (HOSPITAL_COMMUNITY): Payer: Self-pay | Admitting: Psychiatry

## 2019-12-29 MED FILL — AMPHETAMINE SALTS 10 MG: 10 | 30 days supply | Qty: 30 | Fill #0

## 2019-12-29 MED FILL — ADDERALL XR 20 MG CAP SA: 20 | 30 days supply | Qty: 30 | Fill #0

## 2020-02-08 ENCOUNTER — Ambulatory Visit (INDEPENDENT_AMBULATORY_CARE_PROVIDER_SITE_OTHER): Payer: BC Managed Care – PPO | Admitting: Licensed Clinical Social Worker

## 2020-02-08 ENCOUNTER — Encounter (HOSPITAL_COMMUNITY): Payer: Self-pay | Admitting: Licensed Clinical Social Worker

## 2020-02-08 ENCOUNTER — Other Ambulatory Visit (HOSPITAL_COMMUNITY): Payer: Self-pay | Admitting: Psychiatry

## 2020-02-08 ENCOUNTER — Other Ambulatory Visit: Payer: Self-pay

## 2020-02-08 DIAGNOSIS — F4323 Adjustment disorder with mixed anxiety and depressed mood: Secondary | ICD-10-CM | POA: Diagnosis not present

## 2020-02-08 MED FILL — AMPHETAMINE SALTS 10 MG: 10 | 30 days supply | Qty: 30 | Fill #0

## 2020-02-08 MED FILL — AMPHETAMINE-DEXTROAMPHET ER: 20 | 30 days supply | Qty: 30 | Fill #0

## 2020-02-08 NOTE — Progress Notes (Signed)
Virtual Visit via Video Note  I connected with Derek Lindsey on 02/08/20 at  2:00 PM EST by a video enabled telemedicine application and verified that I am speaking with the correct person using two identifiers.   I discussed the limitations of evaluation and management by telemedicine and the availability of in person appointments. The patient expressed understanding and agreed to proceed.  LOCATION: Patient: Home Provider: Home Office  History of Present Illness: Patient is self referred in his return to therapy. He continues to have adjustment issues, now in [employer].     Observations/Objective: Patient presented depressed for his counseling session Patient returns for therapy during 2nd year of [employer].  Patient discussed his psychiatric symptoms and current life events. Patient discussed his current stressors: "relationships, my health, my mental health."  Reviewed tx plan with patient who verbalized acceptance of the plan. Used CBT to help pt deal with his adjustment difficulties of 2nd year [employer]. Made new appointments.  Plan:  21 day gratitude journal,  Rewiring your brain."     PLAN: Low frustration tolerance, mindfulness skills,  rewiring your brain book, gratitude journal  Assessment and Plan: : Counselor will continue to meet with patient to address treatment plan goals. Patient will continue to follow recommendations of providers and implement skills learned in session.     Follow Up Instructions: I discussed the assessment and treatment plan with the patient. The patient was provided an opportunity to ask questions and all were answered. The patient agreed with the plan and demonstrated an understanding of the instructions.   The patient was advised to call back or seek an in-person evaluation if the symptoms worsen or if the condition fails to improve as anticipated.  I provided 60 minutes of non-face-to-face time during this encounter.   Derek Lindsey  S, LCAS

## 2020-02-16 ENCOUNTER — Other Ambulatory Visit: Payer: Self-pay

## 2020-02-16 ENCOUNTER — Encounter (HOSPITAL_COMMUNITY): Payer: Self-pay | Admitting: Licensed Clinical Social Worker

## 2020-02-16 ENCOUNTER — Ambulatory Visit (INDEPENDENT_AMBULATORY_CARE_PROVIDER_SITE_OTHER): Payer: BC Managed Care – PPO | Admitting: Licensed Clinical Social Worker

## 2020-02-16 DIAGNOSIS — F1010 Alcohol abuse, uncomplicated: Secondary | ICD-10-CM | POA: Insufficient documentation

## 2020-02-16 DIAGNOSIS — F4323 Adjustment disorder with mixed anxiety and depressed mood: Secondary | ICD-10-CM | POA: Diagnosis not present

## 2020-02-16 DIAGNOSIS — F1210 Cannabis abuse, uncomplicated: Secondary | ICD-10-CM | POA: Diagnosis not present

## 2020-02-16 NOTE — Progress Notes (Signed)
Virtual Visit via Video Note  I connected with Derek Lindsey on 02/16/20 at  2:00 PM EST by a video enabled telemedicine application and verified that I am speaking with the correct person using two identifiers.   I discussed the limitations of evaluation and management by telemedicine and the availability of in person appointments. The patient expressed understanding and agreed to proceed.  LOCATION: Patient: Home Provider: Home Office  History of Present Illness: Patient is self referred in his return to therapy. He continues to have adjustment issues, now in [employer].     Observations/Objective: Patient presented depressed for his virtual individual counseling session.  Patient discussed his psychiatric symptoms and current life events. Patient discussed his current stressors: "relationships, my marijuana and alcohol use, my mental health."  Used socratic questions. Cln provided psychoeducation on "change, happiness and gratitude." Cln emailed 21 days of gratitude. Cln suggested pt begin journaling his feelings daily. Cln suggested Ted Talks [employer] and [employer].  Assessment and Plan: : Counselor will continue to meet with patient to address treatment plan goals. Patient will continue to follow recommendations of providers and implement skills learned in session.     Follow Up Instructions: I discussed the assessment and treatment plan with the patient. The patient was provided an opportunity to ask questions and all were answered. The patient agreed with the plan and demonstrated an understanding of the instructions.   The patient was advised to call back or seek an in-person evaluation if the symptoms worsen or if the condition fails to improve as anticipated.  I provided 60 minutes of non-face-to-face time during this encounter.   Delance Weide S, LCAS

## 2020-02-23 ENCOUNTER — Ambulatory Visit (HOSPITAL_COMMUNITY): Payer: BC Managed Care – PPO | Admitting: Licensed Clinical Social Worker

## 2020-02-23 ENCOUNTER — Other Ambulatory Visit: Payer: Self-pay

## 2020-02-25 ENCOUNTER — Other Ambulatory Visit: Payer: Self-pay

## 2020-02-25 ENCOUNTER — Ambulatory Visit (INDEPENDENT_AMBULATORY_CARE_PROVIDER_SITE_OTHER): Payer: BC Managed Care – PPO | Admitting: Licensed Clinical Social Worker

## 2020-02-25 DIAGNOSIS — F1010 Alcohol abuse, uncomplicated: Secondary | ICD-10-CM | POA: Diagnosis not present

## 2020-02-25 DIAGNOSIS — F1210 Cannabis abuse, uncomplicated: Secondary | ICD-10-CM | POA: Diagnosis not present

## 2020-02-25 DIAGNOSIS — F4323 Adjustment disorder with mixed anxiety and depressed mood: Secondary | ICD-10-CM

## 2020-02-25 NOTE — Progress Notes (Signed)
Virtual Visit via Video Note  I connected with Al Decant on 02/25/20 at  2:00 PM EST by a video enabled telemedicine application and verified that I am speaking with the correct person using two identifiers.   I discussed the limitations of evaluation and management by telemedicine and the availability of in person appointments. The patient expressed understanding and agreed to proceed.  LOCATION: Patient: Home Provider: Home Office  History of Present Illness: Patient is self referred in his return to therapy. He continues to have adjustment issues, now in [employer].     Observations/Objective: Patient presented depressed for his virtual individual counseling session.  Patient discussed his psychiatric symptoms and current life events. "My moods have been depressed this week and my coping skills have been using marijuana and drinking." Clinician utilized MI OARS to affirm concerns of his continued substance use. Clinician challenged his  current coping skills. Cln provided psychoeducation on effective coping skills. Cln and pt identified goals for his s/a tx plan.    Cln emailed 21 days of gratitude. Cln suggested pt begin journaling his feelings daily. Cln suggested Ted Talks [employer] and [employer].  Assessment and Plan: : Counselor will continue to meet with patient to address treatment plan goals. Patient will continue to follow recommendations of providers and implement skills learned in session.     Follow Up Instructions: I discussed the assessment and treatment plan with the patient. The patient was provided an opportunity to ask questions and all were answered. The patient agreed with the plan and demonstrated an understanding of the instructions.   The patient was advised to call back or seek an in-person evaluation if the symptoms worsen or if the condition fails to improve as anticipated.  I provided 60 minutes of non-face-to-face time during this  encounter.   , S, LCAS

## 2020-02-29 ENCOUNTER — Encounter (HOSPITAL_COMMUNITY): Payer: Self-pay | Admitting: Licensed Clinical Social Worker

## 2020-02-29 ENCOUNTER — Ambulatory Visit (HOSPITAL_COMMUNITY): Payer: BC Managed Care – PPO | Admitting: Licensed Clinical Social Worker

## 2020-03-01 ENCOUNTER — Encounter (HOSPITAL_COMMUNITY): Payer: Self-pay | Admitting: Licensed Clinical Social Worker

## 2020-03-01 ENCOUNTER — Ambulatory Visit (INDEPENDENT_AMBULATORY_CARE_PROVIDER_SITE_OTHER): Payer: BC Managed Care – PPO | Admitting: Licensed Clinical Social Worker

## 2020-03-01 ENCOUNTER — Other Ambulatory Visit: Payer: Self-pay

## 2020-03-01 DIAGNOSIS — F1210 Cannabis abuse, uncomplicated: Secondary | ICD-10-CM

## 2020-03-01 DIAGNOSIS — F1010 Alcohol abuse, uncomplicated: Secondary | ICD-10-CM

## 2020-03-01 DIAGNOSIS — F4323 Adjustment disorder with mixed anxiety and depressed mood: Secondary | ICD-10-CM | POA: Diagnosis not present

## 2020-03-01 NOTE — Progress Notes (Signed)
Virtual Visit via Video Note  I connected with Derek Lindsey on 03/01/20 at  2:00 PM EST by a video enabled telemedicine application and verified that I am speaking with the correct person using two identifiers.   I discussed the limitations of evaluation and management by telemedicine and the availability of in person appointments. The patient expressed understanding and agreed to proceed.  LOCATION: Patient: Home Provider: Home Office  History of Present Illness: Patient is self referred in his return to therapy. He continues to have adjustment issues, now in [employer].     Observations/Objective: Patient presented depressed for his virtual individual counseling session.  Patient discussed his psychiatric symptoms and current life events. "My moods have been depressed this week and my coping skills have been using marijuana and drinking." Clinician utilized MI OARS to affirm concerns of his continued substance use and challenge his  current coping skills. Cln provided the benefits and psychoeducation on yoga and guided meditation.     21 days of gratitude. journaling his feelings daily. Cln suggested Ted Talks [employer] and [employer].  Assessment and Plan: : Counselor will continue to meet with patient to address treatment plan goals. Patient will continue to follow recommendations of providers and implement skills learned in session.     Follow Up Instructions: I discussed the assessment and treatment plan with the patient. The patient was provided an opportunity to ask questions and all were answered. The patient agreed with the plan and demonstrated an understanding of the instructions.   The patient was advised to call back or seek an in-person evaluation if the symptoms worsen or if the condition fails to improve as anticipated.  I provided 60 minutes of non-face-to-face time during this encounter.   Tashunda Vandezande S, LCAS

## 2020-03-10 ENCOUNTER — Ambulatory Visit (INDEPENDENT_AMBULATORY_CARE_PROVIDER_SITE_OTHER): Payer: BC Managed Care – PPO | Admitting: Licensed Clinical Social Worker

## 2020-03-10 ENCOUNTER — Other Ambulatory Visit: Payer: Self-pay

## 2020-03-10 DIAGNOSIS — F1210 Cannabis abuse, uncomplicated: Secondary | ICD-10-CM | POA: Diagnosis not present

## 2020-03-10 DIAGNOSIS — F4323 Adjustment disorder with mixed anxiety and depressed mood: Secondary | ICD-10-CM

## 2020-03-10 DIAGNOSIS — F1010 Alcohol abuse, uncomplicated: Secondary | ICD-10-CM

## 2020-03-10 NOTE — Progress Notes (Signed)
Virtual Visit via Video Note  I connected with Derek Lindsey on 03/10/20 at 10:00 AM EST by a video enabled telemedicine application and verified that I am speaking with the correct person using two identifiers.   I discussed the limitations of evaluation and management by telemedicine and the availability of in person appointments. The patient expressed understanding and agreed to proceed.  LOCATION: Patient: Home Provider: Home Office  History of Present Illness: Patient is self referred in his return to therapy. He continues to have adjustment issues, now in [employer].     Observations/Objective: Patient presented depressed for his virtual individual counseling session.  Patient discussed his psychiatric symptoms and current life events. "My moods continue to be depressed and my coping skills continue to be using marijuana and drinking." Cln provided psychoeducation on benefits of 12-step recovery. Clinician utilized MI OARS to affirm concerns of his continued use of AOD as a coping skill. Clinician challenged pt's thoughts about reasons behind AOD use. Cln provided alternative coping skills. Pt reports he's willing to try alternatives.      PLAN: 21 days of gratitude. journaling his feelings daily. Cln suggested Ted Talks [employer] and [employer].  Assessment and Plan: : Counselor will continue to meet with patient to address treatment plan goals. Patient will continue to follow recommendations of providers and implement skills learned in session.     Follow Up Instructions: I discussed the assessment and treatment plan with the patient. The patient was provided an opportunity to ask questions and all were answered. The patient agreed with the plan and demonstrated an understanding of the instructions.   The patient was advised to call back or seek an in-person evaluation if the symptoms worsen or if the condition fails to improve as anticipated.  I provided 60 minutes of  non-face-to-face time during this encounter.   Marijah Larranaga S, LCAS

## 2020-03-14 ENCOUNTER — Ambulatory Visit (HOSPITAL_COMMUNITY): Payer: BC Managed Care – PPO | Admitting: Licensed Clinical Social Worker

## 2020-03-15 ENCOUNTER — Encounter (HOSPITAL_COMMUNITY): Payer: Self-pay | Admitting: Licensed Clinical Social Worker

## 2020-03-16 ENCOUNTER — Ambulatory Visit (HOSPITAL_COMMUNITY): Payer: BC Managed Care – PPO | Admitting: Licensed Clinical Social Worker

## 2020-03-21 ENCOUNTER — Ambulatory Visit (HOSPITAL_COMMUNITY): Payer: BC Managed Care – PPO | Admitting: Licensed Clinical Social Worker

## 2020-03-22 ENCOUNTER — Other Ambulatory Visit: Payer: Self-pay

## 2020-03-22 ENCOUNTER — Ambulatory Visit (HOSPITAL_COMMUNITY): Payer: BC Managed Care – PPO | Admitting: Licensed Clinical Social Worker

## 2020-03-28 ENCOUNTER — Ambulatory Visit (HOSPITAL_COMMUNITY): Payer: BC Managed Care – PPO | Admitting: Licensed Clinical Social Worker

## 2020-03-31 ENCOUNTER — Encounter (HOSPITAL_COMMUNITY): Payer: Self-pay | Admitting: Licensed Clinical Social Worker

## 2020-03-31 ENCOUNTER — Ambulatory Visit (INDEPENDENT_AMBULATORY_CARE_PROVIDER_SITE_OTHER): Payer: BC Managed Care – PPO | Admitting: Licensed Clinical Social Worker

## 2020-03-31 ENCOUNTER — Other Ambulatory Visit: Payer: Self-pay

## 2020-03-31 DIAGNOSIS — F1210 Cannabis abuse, uncomplicated: Secondary | ICD-10-CM | POA: Diagnosis not present

## 2020-03-31 DIAGNOSIS — F4323 Adjustment disorder with mixed anxiety and depressed mood: Secondary | ICD-10-CM

## 2020-03-31 DIAGNOSIS — F1010 Alcohol abuse, uncomplicated: Secondary | ICD-10-CM

## 2020-03-31 NOTE — Progress Notes (Signed)
Virtual Visit via Video Note  I connected with Al Decant on 03/31/20 at 10:00 AM EST by a video enabled telemedicine application and verified that I am speaking with the correct person using two identifiers.   I discussed the limitations of evaluation and management by telemedicine and the availability of in person appointments. The patient expressed understanding and agreed to proceed.  LOCATION: Patient: Home Provider: Home Office  History of Present Illness: Patient is self referred in his return to therapy. He continues to have adjustment issues, now in [place] [employer].     Observations/Objective: Patient presented depressed for his virtual individual counseling session.  Patient discussed his psychiatric symptoms and current life events. "My moods continue to be depressed and my coping skills continue to be using marijuana and drinking." Cln used socratic questions, suggesting alternative coping skills, that have been emailed previously. Cln explored with patient the stages of change; Contemplation stage, where patient is evaluating reasons for and against change. Clinician explored motivation and asssited him to resolve ambivalence recognizing there is a problem but will not consider change at this time. Patient reports he is having relationship issues, even though "I'm not in a relationship currently."  Cln provivded psychoeducation on healthy relationships, effective communication skills, not being self centered/self absorbed.   PLAN: screenings     PLAN: 21 days of gratitude. journaling his feelings daily. Cln suggested Ted Talks [employer] and [employer].  Assessment and Plan: : Counselor will continue to meet with patient to address treatment plan goals. Patient will continue to follow recommendations of providers and implement skills learned in session.     Follow Up Instructions: I discussed the assessment and treatment plan with the patient. The patient was provided an  opportunity to ask questions and all were answered. The patient agreed with the plan and demonstrated an understanding of the instructions.   The patient was advised to call back or seek an in-person evaluation if the symptoms worsen or if the condition fails to improve as anticipated.  I provided 45 minutes of non-face-to-face time during this encounter.   Kazue Cerro S, LCAS

## 2020-04-06 ENCOUNTER — Ambulatory Visit (INDEPENDENT_AMBULATORY_CARE_PROVIDER_SITE_OTHER): Payer: BC Managed Care – PPO | Admitting: Licensed Clinical Social Worker

## 2020-04-06 ENCOUNTER — Other Ambulatory Visit: Payer: Self-pay

## 2020-04-06 DIAGNOSIS — F1010 Alcohol abuse, uncomplicated: Secondary | ICD-10-CM

## 2020-04-06 DIAGNOSIS — F4323 Adjustment disorder with mixed anxiety and depressed mood: Secondary | ICD-10-CM | POA: Diagnosis not present

## 2020-04-06 DIAGNOSIS — F1210 Cannabis abuse, uncomplicated: Secondary | ICD-10-CM

## 2020-04-12 ENCOUNTER — Encounter (HOSPITAL_COMMUNITY): Payer: Self-pay | Admitting: Licensed Clinical Social Worker

## 2020-04-12 NOTE — Progress Notes (Signed)
Virtual Visit via Video Note  I connected with Derek Lindsey on 3/92022 at  5:00 PM EST by a video enabled telemedicine application and verified that I am speaking with the correct person using two identifiers.   I discussed the limitations of evaluation and management by telemedicine and the availability of in person appointments. The patient expressed understanding and agreed to proceed.  LOCATION: Patient: Home Provider: Home Office  History of Present Illness: Patient is self referred in his return to therapy. He continues to have adjustment issues, now in [employer].     Observations/Objective: Patient presented depressed for his virtual individual counseling session.  Patient discussed his psychiatric symptoms and current life events. Patient saw his parents for his birthday this past weekend. He has a contentious relationship with them. Cln assessed Unresolved/generational/historic family issues. Cln assessed the family dynamics. Cln provided psychoeducation on dynamics of healthy vs unhealthy relationships associated with power and control. Cln validated and normalized patient's feelings while redirecting his focus to things that he can control. Cln will continue to monitor.  Cln explored with patient the stages of change; Contemplation stage, where patient is evaluating reasons for and against change. Clinician explored motivation and asssited him to resolve ambivalence recognizing there is a problem but will not consider change at this time.   PLAN: screenings    PLAN: 21 days of gratitude. journaling his feelings daily. Cln suggested [employer] and [employer].  Assessment and Plan: : Counselor will continue to meet with patient to address treatment plan goals. Patient will continue to follow recommendations of providers and implement skills learned in session.     Follow Up Instructions: I discussed the assessment and treatment plan with the patient. The patient  was provided an opportunity to ask questions and all were answered. The patient agreed with the plan and demonstrated an understanding of the instructions.   The patient was advised to call back or seek an in-person evaluation if the symptoms worsen or if the condition fails to improve as anticipated.  I provided 45 minutes of non-face-to-face time during this encounter.   Derek Lindsey S, LCAS

## 2020-04-13 ENCOUNTER — Ambulatory Visit (INDEPENDENT_AMBULATORY_CARE_PROVIDER_SITE_OTHER): Payer: BC Managed Care – PPO | Admitting: Licensed Clinical Social Worker

## 2020-04-13 ENCOUNTER — Other Ambulatory Visit: Payer: Self-pay

## 2020-04-13 DIAGNOSIS — F1010 Alcohol abuse, uncomplicated: Secondary | ICD-10-CM | POA: Diagnosis not present

## 2020-04-13 DIAGNOSIS — F1210 Cannabis abuse, uncomplicated: Secondary | ICD-10-CM | POA: Diagnosis not present

## 2020-04-13 DIAGNOSIS — F4323 Adjustment disorder with mixed anxiety and depressed mood: Secondary | ICD-10-CM

## 2020-04-18 ENCOUNTER — Other Ambulatory Visit: Payer: Self-pay

## 2020-04-18 ENCOUNTER — Encounter (HOSPITAL_COMMUNITY): Payer: Self-pay | Admitting: Licensed Clinical Social Worker

## 2020-04-18 ENCOUNTER — Ambulatory Visit (HOSPITAL_COMMUNITY): Payer: BC Managed Care – PPO | Admitting: Licensed Clinical Social Worker

## 2020-04-18 NOTE — Progress Notes (Signed)
Virtual Visit via Video Note  I connected with Derek Lindsey on 04/13/2020 at  1:00 PM EDT by a video enabled telemedicine application and verified that I am speaking with the correct person using two identifiers.   I discussed the limitations of evaluation and management by telemedicine and the availability of in person appointments. The patient expressed understanding and agreed to proceed.  LOCATION: Patient: Home Provider: Home Office  History of Present Illness: Patient is self referred in his return to therapy. He continues to have adjustment issues, now in [employer].     Observations/Objective: Patient presented depressed for his virtual individual counseling session.  Patient discussed his psychiatric symptoms and current life events. Patient reports his moods have been mixed this past week. I have been having negative thoughts, Clinician utilized CBT to address his thought processes. Clinician provided thought stopping tools, as well as reality testing to provide support and confidence in his decisions. Again, Cln explored with patient the stages of change; Contemplation stage, where patient is evaluating reasons for and against change. Clinician explored motivation and asssited him to resolve ambivalence recognizing there is a problem but will not consider change at this time.   PLAN: screenings    PLAN: 21 days of gratitude. journaling his feelings daily. Cln suggested [employer] and [employer].  Assessment and Plan: : Counselor will continue to meet with patient to address treatment plan goals. Patient will continue to follow recommendations of providers and implement skills learned in session.     Follow Up Instructions: I discussed the assessment and treatment plan with the patient. The patient was provided an opportunity to ask questions and all were answered. The patient agreed with the plan and demonstrated an understanding of the instructions.   The  patient was advised to call back or seek an in-person evaluation if the symptoms worsen or if the condition fails to improve as anticipated.  I provided 45 minutes of non-face-to-face time during this encounter.   Derek Lindsey, LCAS

## 2020-04-26 MED FILL — AMPHETAMINE SALTS 10 MG: 10 | 30 days supply | Qty: 30 | Fill #0

## 2020-04-26 MED FILL — AMPHETAMINE-DEXTROAMPHET ER: 20 | 30 days supply | Qty: 30 | Fill #0

## 2020-05-30 ENCOUNTER — Other Ambulatory Visit (HOSPITAL_COMMUNITY): Payer: Self-pay

## 2020-05-30 MED FILL — Amphetamine-Dextroamphetamine Cap ER 24HR 20 MG: ORAL | 30 days supply | Qty: 30 | Fill #0 | Status: AC

## 2020-05-30 MED FILL — Amphetamine-Dextroamphetamine Tab 10 MG: ORAL | 30 days supply | Qty: 30 | Fill #0 | Status: AC

## 2020-08-02 ENCOUNTER — Encounter: Payer: Self-pay | Admitting: *Deleted

## 2020-09-20 ENCOUNTER — Other Ambulatory Visit (HOSPITAL_COMMUNITY): Payer: Self-pay

## 2020-09-21 ENCOUNTER — Other Ambulatory Visit (HOSPITAL_COMMUNITY): Payer: Self-pay

## 2020-09-21 MED ORDER — AMPHETAMINE-DEXTROAMPHET ER 20 MG PO CP24
20.0000 mg | ORAL_CAPSULE | Freq: Every morning | ORAL | 0 refills | Status: DC
  Filled 2020-09-21: qty 30, 30d supply, fill #0

## 2020-09-21 MED ORDER — AMPHETAMINE-DEXTROAMPHETAMINE 10 MG PO TABS
10.0000 mg | ORAL_TABLET | Freq: Every day | ORAL | 0 refills | Status: DC | PRN
  Filled 2020-09-21: qty 30, 30d supply, fill #0

## 2021-03-23 ENCOUNTER — Other Ambulatory Visit (HOSPITAL_COMMUNITY): Payer: Self-pay

## 2021-03-24 ENCOUNTER — Other Ambulatory Visit (HOSPITAL_COMMUNITY): Payer: Self-pay

## 2021-03-27 ENCOUNTER — Other Ambulatory Visit (HOSPITAL_COMMUNITY): Payer: Self-pay

## 2021-03-28 ENCOUNTER — Other Ambulatory Visit (HOSPITAL_COMMUNITY): Payer: Self-pay

## 2021-04-03 ENCOUNTER — Other Ambulatory Visit (HOSPITAL_COMMUNITY): Payer: Self-pay

## 2021-04-03 MED ORDER — AMPHETAMINE-DEXTROAMPHETAMINE 10 MG PO TABS: 10.0000 mg | ORAL_TABLET | Freq: Every day | ORAL | 0 refills | Status: DC | PRN

## 2021-04-03 MED ORDER — AMPHETAMINE-DEXTROAMPHET ER 20 MG PO CP24: 20.0000 mg | ORAL_CAPSULE | Freq: Every morning | ORAL | 0 refills | Status: DC

## 2021-04-03 MED ORDER — AMPHETAMINE-DEXTROAMPHETAMINE 10 MG PO TABS
10.0000 mg | ORAL_TABLET | Freq: Every day | ORAL | 0 refills | Status: DC | PRN
  Filled 2021-04-03: qty 30, 30d supply, fill #0

## 2021-04-03 MED ORDER — AMPHETAMINE-DEXTROAMPHET ER 20 MG PO CP24
20.0000 mg | ORAL_CAPSULE | Freq: Every morning | ORAL | 0 refills | Status: DC
  Filled 2021-04-03: qty 30, 30d supply, fill #0

## 2021-04-04 ENCOUNTER — Other Ambulatory Visit (HOSPITAL_COMMUNITY): Payer: Self-pay

## 2021-05-04 ENCOUNTER — Other Ambulatory Visit (HOSPITAL_COMMUNITY): Payer: Self-pay

## 2021-05-04 MED ORDER — AMPHETAMINE-DEXTROAMPHETAMINE 10 MG PO TABS
10.0000 mg | ORAL_TABLET | Freq: Every day | ORAL | 0 refills | Status: DC | PRN
  Filled 2021-05-04: qty 30, 30d supply, fill #0

## 2021-05-04 MED ORDER — AMPHETAMINE-DEXTROAMPHETAMINE 10 MG PO TABS: 10.0000 mg | ORAL_TABLET | Freq: Every day | ORAL | 0 refills | Status: DC | PRN

## 2021-05-04 MED ORDER — AMPHETAMINE-DEXTROAMPHET ER 20 MG PO CP24: 20.0000 mg | ORAL_CAPSULE | Freq: Every morning | ORAL | 0 refills | Status: DC

## 2021-05-04 MED ORDER — AMPHETAMINE-DEXTROAMPHET ER 20 MG PO CP24
20.0000 mg | ORAL_CAPSULE | Freq: Every morning | ORAL | 0 refills | Status: DC
Start: 2021-05-04 — End: 2022-11-13
  Filled 2021-05-04: qty 30, 30d supply, fill #0

## 2021-05-12 ENCOUNTER — Other Ambulatory Visit (HOSPITAL_COMMUNITY): Payer: Self-pay

## 2021-05-15 ENCOUNTER — Other Ambulatory Visit (HOSPITAL_COMMUNITY): Payer: Self-pay

## 2021-08-03 ENCOUNTER — Telehealth: Payer: Self-pay | Admitting: Pharmacist

## 2021-08-03 DIAGNOSIS — Z2089 Contact with and (suspected) exposure to other communicable diseases: Secondary | ICD-10-CM

## 2021-08-03 MED ORDER — CIPROFLOXACIN HCL 500 MG PO TABS: 500.0000 mg | ORAL_TABLET | Freq: Once | ORAL | 0 refills | Status: AC

## 2021-08-03 NOTE — Telephone Encounter (Signed)
Received call from patient's father, Raheem Kolbe. Patient's brother received call from [facility] at [facility] that stated he was exposed to a patient that tested positive for Neisseria meningitidis. They recommended that he take ciprofloxacin 500 mg PO x 1. Patient is traveling across country with his brother and is near [place], [place]. [facility] stated that they can only send his prescription to [facility]. Patient's father is asking if I would be willing to send Rx to a pharmacy near him. Discussed with Dr. Renold Don and he is ok with sending script. Sent ciprofloxacin 500 mg PO x 1 to [facility] in [place]. Will let patient's father, Vonna Kotyk, know that it was sent.    Melane Windholz L. Lona Six, PharmD, BCIDP, AAHIVP, CPP Clinical Pharmacist Practitioner Infectious Diseases Clinical Pharmacist [facility] 08/03/2021, 4:09 PM

## 2022-11-13 ENCOUNTER — Ambulatory Visit: Payer: PRIVATE HEALTH INSURANCE | Admitting: Student in an Organized Health Care Education/Training Program

## 2022-11-13 ENCOUNTER — Encounter: Payer: Self-pay | Admitting: Student in an Organized Health Care Education/Training Program

## 2022-11-13 VITALS — BP 130/73 | HR 70 | Temp 98.6°F | Ht 71.0 in | Wt 155.9 lb

## 2022-11-13 DIAGNOSIS — F4320 Adjustment disorder, unspecified: Secondary | ICD-10-CM | POA: Diagnosis not present

## 2022-11-13 DIAGNOSIS — F900 Attention-deficit hyperactivity disorder, predominantly inattentive type: Secondary | ICD-10-CM | POA: Diagnosis not present

## 2022-11-13 DIAGNOSIS — F4321 Adjustment disorder with depressed mood: Secondary | ICD-10-CM

## 2022-11-13 DIAGNOSIS — Z9189 Other specified personal risk factors, not elsewhere classified: Secondary | ICD-10-CM | POA: Diagnosis not present

## 2022-11-13 MED ORDER — CITALOPRAM HYDROBROMIDE 20 MG PO TABS: 20.0000 mg | ORAL_TABLET | Freq: Every day | ORAL | 1 refills | Status: DC

## 2022-11-13 MED ORDER — HYDROXYZINE HCL 10 MG PO TABS: 10.0000 mg | ORAL_TABLET | Freq: Every evening | ORAL | 1 refills | Status: DC | PRN

## 2022-11-13 NOTE — Assessment & Plan Note (Signed)
Drinking about 4 beers daily, which is more than is considered safe.  Likely associated with adjustment disorder.  No signs right now but it is impacting his functional status or his responsibilities.  No signs of physical harm on his exam, no signs of liver disease.  Will going to treat the underlying adjustment mood disorder and I anticipate the alcohol use will improve.  Will follow-up on this in 4 weeks.

## 2022-11-13 NOTE — Progress Notes (Addendum)
New Patient Office Visit  Subjective    Patient ID: Derek Lindsey, male    DOB: 1990-05-22  Age: 32 y.o. MRN: [id number]  CC:  Chief Complaint  Patient presents with   New Patient (Initial Visit)    HPI  Derek Lindsey presents for evaluation of acute mood disorder.  Patient is a healthy young man, last saw our clinic in May 2021, has had no other healthcare needs since that time.  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  Over the last 2 weeks he has had a severe change in mood, more depressed mood, difficulty sleeping, having anxiety and intrusive thoughts at night.  This is impacting his daily functioning, making it hard for him to work.  He has anhedonia, he has change in appetite, he reports a recent 10 pound weight loss.  Usually is an avid runner, but has stopped exercising.  The symptoms coincided with a recent break-up in a relationship.  Denies any symptoms of hypothyroidism.  Denies any fevers or chills.  No dyspnea with exertion, no change in exertional capacity, no chest discomfort, headaches, changes in skin or hair.  Denies any nausea, vomiting, or abdominal discomfort.  He does report drinking between 3 and 4 beers on a nightly basis to help with sleep.  Denies any alcohol use during the daytime. Alcohol use not currently impacting his responsibilities or function.   Outpatient Encounter Medications as of 11/13/2022  Medication Sig   citalopram (CELEXA) 20 MG tablet Take 1 tablet (20 mg total) by mouth daily.   hydrOXYzine (ATARAX) 10 MG tablet Take 1 tablet (10 mg total) by mouth at bedtime as needed for anxiety (insomnia).   [DISCONTINUED] amphetamine-dextroamphetamine (ADDERALL XR) 20 MG 24 hr capsule Take 1 capsule (20 mg total) by mouth in the morning.   [DISCONTINUED] amphetamine-dextroamphetamine (ADDERALL XR) 20 MG 24 hr  capsule Take 1 capsule (20 mg total) by mouth every morning   [DISCONTINUED] amphetamine-dextroamphetamine (ADDERALL XR) 20 MG 24 hr capsule Take 1 capsule (20 mg total) by mouth every morning.   [DISCONTINUED] amphetamine-dextroamphetamine (ADDERALL XR) 20 MG 24 hr capsule Take 1 capsule (20 mg total) by mouth every morning   [DISCONTINUED] amphetamine-dextroamphetamine (ADDERALL XR) 20 MG 24 hr capsule Take 1 capsule (20 mg total) by mouth every morning.   [DISCONTINUED] amphetamine-dextroamphetamine (ADDERALL XR) 20 MG 24 hr capsule Take 1 capsule (20 mg total) by mouth every morning.   [DISCONTINUED] amphetamine-dextroamphetamine (ADDERALL XR) 20 MG 24 hr capsule Take 1 capsule (20 mg total) by mouth every morning.   [DISCONTINUED] amphetamine-dextroamphetamine (ADDERALL) 10 MG tablet Take 10 mg by mouth daily as needed (ADHD).    [DISCONTINUED] amphetamine-dextroamphetamine (ADDERALL) 10 MG tablet Take 1 tablet (10 mg total) by mouth daily as needed.   [DISCONTINUED] amphetamine-dextroamphetamine (ADDERALL) 10 MG tablet Take 1 tablet (10 mg total) by mouth daily as needed.   [DISCONTINUED] amphetamine-dextroamphetamine (ADDERALL) 10 MG tablet Take 1 tablet (10 mg total) by mouth daily as needed.   [DISCONTINUED] amphetamine-dextroamphetamine (ADDERALL) 10 MG tablet Take 1 tablet (10 mg total) by mouth daily as needed.   [DISCONTINUED] amphetamine-dextroamphetamine (ADDERALL) 10 MG tablet Take 1 tablet (10 mg total) by mouth daily as needed.   [DISCONTINUED] amphetamine-dextroamphetamine (ADDERALL) 10 MG tablet Take 1 tablet (10 mg total) by mouth daily as needed.   [DISCONTINUED] amphetamine-dextroamphetamine (ADDERALL) 10 MG tablet Take 1  tablet (10 mg total) by mouth daily as needed.   No facility-administered encounter medications on file as of 11/13/2022.    Past Medical History:  Diagnosis Date   ADD (attention deficit disorder with hyperactivity)    Kidney calculi     Past Surgical  History:  Procedure Laterality Date   Basket Uteroscopy  approx "2-3 years ago"    Family History  Problem Relation Age of Onset   Cancer Mother        lung   Anxiety disorder Father    Depression Father    Cancer Maternal Grandfather        lung   Anxiety disorder Sister    Depression Sister    Dementia Maternal Grandmother    Alcohol abuse Paternal Grandfather     Social History   Socioeconomic History   Marital status: Single    Spouse name: Not on file   Number of children: Not on file   Years of education: Not on file   Highest education level: Not on file  Occupational History   Not on file  Tobacco Use   Smoking status: Never   Smokeless tobacco: Never  Vaping Use   Vaping status: Never Used  Substance and Sexual Activity   Alcohol use: Yes    Alcohol/week: 5.0 standard drinks of alcohol    Types: 5 Cans of beer per week    Comment: weekly   Drug use: No   Sexual activity: Yes    Partners: Female    Birth control/protection: Condom  Other Topics Concern   Not on file  Social History Narrative   Not on file   Social Determinants of Health   Financial Resource Strain: Not on file  Food Insecurity: Not on file  Transportation Needs: Not on file  Physical Activity: Not on file  Stress: Not on file  Social Connections: Not on file  Intimate Partner Violence: Not on file     Objective    BP 130/73   Pulse 70   Temp 98.6 F (37 C) (Oral)   Ht 5\' 11"  (1.803 m)   Wt 155 lb 14.4 oz (70.7 kg)   SpO2 98%   BMI 21.74 kg/m   Physical Exam  Gen: Well-appearing young man, no distress ENT: Normal thyroid, no masses, no lymphadenopathy CV: Regular rate, no murmurs Lungs: Clear to auscultation throughout, normal work of breathing Extremities: Warm and well-perfused with no lower extremity edema, normal joints Neuro: Alert, conversational, full strength in the upper and lower extremities, normal gait Psych: Moderately depressed affect   Assessment  & Plan:   Problem List Items Addressed This Visit       High   Adjustment disorder - Primary (Chronic)    New problem severe symptom burden depressed mood, anxiety, insomnia.  His functional status PHQ-9 score of 21 and GAD 7 score 14.  No suicidal ideation, low risk for self-harm.  We talked about medication management.  Plan will be to start  citalopram 10 mg daily for 6 days and then increase to 20 mg daily.  For sleep, can use hydroxyzine 10 mg at night as needed for insomnia.  We talked about the side effects of escitalopram including sexual dysfunction.  Follow-up with me in 4 weeks.  Will check TSH to rule out type, CBC to rule out anemia given increased fatigue, and CMP to rule out other metabolic disturbance.      Relevant Orders   CMP14 + Anion Gap  CBC no Diff   TSH     Medium    ADHD (attention deficit hyperactivity disorder), inattentive type (Chronic)    History of attention deficit disorder previously treated with Adderall while he was in discission assisted school.  He discontinued this medication in 2023.  This problem is now chronic and stable without medication.  No need to restart medication at this time      At risk alcohol consumption (Chronic)    Drinking about 4 beers daily, which is more than is considered safe.  Likely associated with adjustment disorder.  No signs right now but it is impacting his functional status or his responsibilities.  No signs of physical harm on his exam, no signs of liver disease.  Will going to treat the underlying adjustment mood disorder and I anticipate the alcohol use will improve.  Will follow-up on this in 4 weeks.       Return in about 4 weeks (around 12/11/2022).   Tyson Alias, MD

## 2022-11-13 NOTE — Assessment & Plan Note (Signed)
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  He discontinued this medication in 2023.  This problem is now chronic and stable without medication.  No need to restart medication at this time

## 2022-11-13 NOTE — Assessment & Plan Note (Addendum)
New problem with severe symptom burden of depressed mood, anxiety, insomnia that is impacting his functional status. PHQ-9 score of 21 and GAD 7 score 14.  No suicidal ideation, low risk for self-harm.  We talked about medication management.  Plan will be to start  citalopram 10 mg daily for 6 days and then increase to 20 mg daily.  For sleep, can use hydroxyzine 10 mg at night as needed for insomnia.  We talked about the side effects of escitalopram including sexual dysfunction and hydroxyzine of sedation. He had a normal QTc on EKG in 2021. Low risk for serotonin syndrome given no other serotonergic agents. Follow-up with me in 4 weeks.  Will check TSH to rule out hypothyroidism, CBC to rule out anemia given increased fatigue, and CMP to rule out other metabolic disturbance like hyponatremia given starting SSRI.

## 2022-11-14 LAB — CMP14 + ANION GAP
ALT: 23 [IU]/L (ref 0–44)
AST: 17 [IU]/L (ref 0–40)
Albumin: 5 g/dL (ref 4.1–5.1)
Alkaline Phosphatase: 57 [IU]/L (ref 44–121)
Anion Gap: 16 mmol/L (ref 10.0–18.0)
BUN/Creatinine Ratio: 16 (ref 9–20)
BUN: 14 mg/dL (ref 6–20)
Bilirubin Total: 0.7 mg/dL (ref 0.0–1.2)
CO2: 24 mmol/L (ref 20–29)
Calcium: 9.9 mg/dL (ref 8.7–10.2)
Chloride: 100 mmol/L (ref 96–106)
Creatinine, Ser: 0.9 mg/dL (ref 0.76–1.27)
Globulin, Total: 2 g/dL (ref 1.5–4.5)
Glucose: 114 mg/dL — ABNORMAL HIGH (ref 70–99)
Potassium: 4.8 mmol/L (ref 3.5–5.2)
Sodium: 140 mmol/L (ref 134–144)
Total Protein: 7 g/dL (ref 6.0–8.5)
eGFR: 117 mL/min/{1.73_m2} (ref >59)

## 2022-11-14 LAB — CBC
Hematocrit: 48.6 % (ref 37.5–51.0)
Hemoglobin: 15.9 g/dL (ref 13.0–17.7)
MCH: 29.7 pg (ref 26.6–33.0)
MCHC: 32.7 g/dL (ref 31.5–35.7)
MCV: 91 fL (ref 79–97)
Platelets: 210 10*3/uL (ref 150–450)
RBC: 5.35 x10E6/uL (ref 4.14–5.80)
RDW: 13 % (ref 11.6–15.4)
WBC: 6.3 10*3/uL (ref 3.4–10.8)

## 2022-11-14 LAB — TSH: TSH: 2.7 u[IU]/mL (ref 0.450–4.500)

## 2022-11-21 ENCOUNTER — Ambulatory Visit (HOSPITAL_COMMUNITY): Payer: PRIVATE HEALTH INSURANCE | Admitting: Licensed Clinical Social Worker

## 2022-11-21 ENCOUNTER — Encounter (HOSPITAL_COMMUNITY): Payer: Self-pay | Admitting: Licensed Clinical Social Worker

## 2022-11-21 DIAGNOSIS — F4323 Adjustment disorder with mixed anxiety and depressed mood: Secondary | ICD-10-CM | POA: Diagnosis not present

## 2022-11-21 NOTE — Progress Notes (Signed)
   THERAPIST PROGRESS NOTE  Session Time: 3:00-4:00 pm  Participation Level: Active  Behavioral Response: CasualAlertAnxious  Type of Therapy: Individual Therapy  Treatment Goals addressed: Build a trusting, therapeutic relationship  ProgressTowards Goals: Initial  Interventions: Supportive  Summary: Derek Lindsey is a 32 y.o. male who presents with adjustment disorder with mixed anxiety and depressed mood. He returns to therapy after a 2 year gap. He presents after a relationship breakup. He is employed as a PA at [employer]. He currently lives in [place] but is moving back to [place].   Therapist Response: Pt returns to therapy with adjustment disorder with mixed anxiety and depressed mood  Plan: Return again in  weeks.  Diagnosis: Adjustment disorder with mixed anxiety and depressed mood  Collaboration of Care: Meet with ongoing providers  Patient/Guardian was advised Release of Information must be obtained prior to any record release in order to collaborate their care with an outside provider. Patient/Guardian was advised if they have not already done so to contact the registration department to sign all necessary forms in order for [place] to release information regarding their care.   Consent: Patient/Guardian gives verbal consent for treatment and assignment of benefits for services provided during this visit. Patient/Guardian expressed understanding and agreed to proceed.   Bralee Feldt S, LCAS 11/21/2022

## 2022-11-27 ENCOUNTER — Ambulatory Visit (HOSPITAL_COMMUNITY): Payer: PRIVATE HEALTH INSURANCE | Admitting: Licensed Clinical Social Worker

## 2022-11-27 ENCOUNTER — Encounter (HOSPITAL_COMMUNITY): Payer: Self-pay | Admitting: Licensed Clinical Social Worker

## 2022-11-27 DIAGNOSIS — F4323 Adjustment disorder with mixed anxiety and depressed mood: Secondary | ICD-10-CM

## 2022-11-27 NOTE — Progress Notes (Addendum)
  THERAPIST PROGRESS NOTE Virtual Visit via Video Note   I connected with Jannifer Hick on 11/27/22 at 2:00 PM EDT by a video enabled telemedicine application and verified that I am speaking with the correct person using two identifiers.   Location: Patient: Home Provider: Home Office   I discussed the limitations of evaluation and management by telemedicine and the availability of in person appointments. The patient expressed understanding and agreed to proceed.    Session Time: 2:00-3:00 pm  Participation Level: Active  Behavioral Response: CasualAlertAnxious  Type of Therapy: Individual Therapy  Treatment Goals addressed: Build a trusting, therapeutic relationship  ProgressTowards Goals: Initial  Interventions: Supportive  Summary: Patient presented for today's session on time and was alert, oriented x5, with no evidence or self-report of SI/HI or A/V H.  Patient reported ongoing compliance with medication.  Clinician inquired about patient's current emotional ratings, as well as any significant changes in thoughts, feelings or behavior since previous session.  Patient reported scores of 8/10 for depression, 7/10 for anxiety, 2/10 for anger/irritability, and denied any panic attacks. Patient wants to continue with therapy so at next session will complete the CCA and screenings. Pt has a habit of coming back to therapy after a relationship breakup. He has a recent breakup and has struggled with adjusting to his current life, without being in a relationship. He is on a 2 week break from work, "I was about to lose my career."   Therapist Response: . Cln provided education on how to to recognize the physical, cognitive, emotional, and behavioral responses that influence his mood. Cln provided education on mindfulness-based coping skills and meditation.  Plan: Return again in 1 week.  Assessment and plan: Counselor will continue to meet with patient to address treatment plan goals.  Patient will continue to follow recommendations of providers and implement skills learned in session. Diagnosis: Adjustment disorder with mixed anxiety and depressed mood  Diagnosis: Adjustment disorder with mixed anxiety and depressed mood  Collaboration of Care: Meet with ongoing providers  Patient/Guardian was advised Release of Information must be obtained prior to any record release in order to collaborate their care with an outside provider. Patient/Guardian was advised if they have not already done so to contact the registration department to sign all necessary forms in order for [place] to release information regarding their care.   Consent: Patient/Guardian gives verbal consent for treatment and assignment of benefits for services provided during this visit. Patient/Guardian expressed understanding and agreed to proceed.   Eymi Lipuma S, LCAS 11/27/2022

## 2022-11-28 ENCOUNTER — Encounter: Payer: Self-pay | Admitting: Student in an Organized Health Care Education/Training Program

## 2022-11-28 DIAGNOSIS — F4321 Adjustment disorder with depressed mood: Secondary | ICD-10-CM

## 2022-11-28 MED ORDER — TRAZODONE HCL 50 MG PO TABS
50.0000 mg | ORAL_TABLET | Freq: Every evening | ORAL | 1 refills | Status: DC | PRN
Start: 2022-11-28 — End: 2022-12-10

## 2022-12-04 ENCOUNTER — Encounter (HOSPITAL_COMMUNITY): Payer: Self-pay | Admitting: Licensed Clinical Social Worker

## 2022-12-04 ENCOUNTER — Ambulatory Visit (INDEPENDENT_AMBULATORY_CARE_PROVIDER_SITE_OTHER): Payer: PRIVATE HEALTH INSURANCE | Admitting: Licensed Clinical Social Worker

## 2022-12-04 DIAGNOSIS — F4323 Adjustment disorder with mixed anxiety and depressed mood: Secondary | ICD-10-CM | POA: Diagnosis not present

## 2022-12-04 NOTE — Progress Notes (Unsigned)
  THERAPIST PROGRESS NOTE Virtual Visit via Video Note   I connected with Derek Lindsey on 12/04/22 at 2:00 PM EDT by a video enabled telemedicine application and verified that I am speaking with the correct person using two identifiers.   Location: Patient: Home Provider: Home Office   I discussed the limitations of evaluation and management by telemedicine and the availability of in person appointments. The patient expressed understanding and agreed to proceed.    Session Time: 2:00-3:00 pm  Participation Level: Active  Behavioral Response: CasualAlertAnxious  Type of Therapy: Individual Therapy  Treatment Goals addressed: Build a trusting, therapeutic relationship  ProgressTowards Goals: Initial  Interventions: Supportive  Summary: Patient presented for today's session on time and was alert, oriented x5, with no evidence or self-report of SI/HI or A/V H.  Patient reported ongoing compliance with medication.  Clinician inquired about patient's current emotional ratings, as well as any significant changes in thoughts, feelings or behavior since previous session.  Patient reported scores of 8/10 for depression, 7/10 for anxiety, 2/10 for anger/irritability, and denied any panic attacks. Patient wants to continue with therapy so at next session will complete the CCA and screenings. Pt has a habit of coming back to therapy after a relationship breakup. He has a recent breakup and has struggled with adjusting to his current life, without being in a relationship. He is on a 2 week break from work, "I was about to lose my career."   Therapist Response: . Cln provided education on how to to recognize the physical, cognitive, emotional, and behavioral responses that influence his mood. Cln provided education on mindfulness-based coping skills and meditation.  Plan: Return again in 1 week.  Assessment and plan: Counselor will continue to meet with patient to address treatment plan goals. Patient  will continue to follow recommendations of providers and implement skills learned in session. Diagnosis: Adjustment disorder with mixed anxiety and depressed mood  Diagnosis: Adjustment disorder with mixed anxiety and depressed mood  Collaboration of Care: Meet with ongoing providers  Patient/Guardian was advised Release of Information must be obtained prior to any record release in order to collaborate their care with an outside provider. Patient/Guardian was advised if they have not already done so to contact the registration department to sign all necessary forms in order for [place] to release information regarding their care.   Consent: Patient/Guardian gives verbal consent for treatment and assignment of benefits for services provided during this visit. Patient/Guardian expressed understanding and agreed to proceed.   Hollace Michelli S, LCAS 12/04/2022

## 2022-12-10 ENCOUNTER — Other Ambulatory Visit (HOSPITAL_COMMUNITY): Payer: Self-pay

## 2022-12-10 ENCOUNTER — Ambulatory Visit: Payer: PRIVATE HEALTH INSURANCE | Admitting: Student in an Organized Health Care Education/Training Program

## 2022-12-10 ENCOUNTER — Encounter: Payer: Self-pay | Admitting: Student in an Organized Health Care Education/Training Program

## 2022-12-10 VITALS — BP 124/77 | HR 63 | Temp 98.0°F | Ht 71.0 in | Wt 157.0 lb

## 2022-12-10 DIAGNOSIS — F4321 Adjustment disorder with depressed mood: Secondary | ICD-10-CM | POA: Diagnosis not present

## 2022-12-10 DIAGNOSIS — Z9189 Other specified personal risk factors, not elsewhere classified: Secondary | ICD-10-CM | POA: Diagnosis not present

## 2022-12-10 DIAGNOSIS — F40243 Fear of flying: Secondary | ICD-10-CM | POA: Insufficient documentation

## 2022-12-10 MED ORDER — TRAZODONE HCL 50 MG PO TABS: 50.0000 mg | ORAL_TABLET | Freq: Every evening | ORAL | 1 refills | Status: DC | PRN

## 2022-12-10 MED ORDER — FLUOXETINE HCL 10 MG PO CAPS
10.0000 mg | ORAL_CAPSULE | Freq: Every day | ORAL | 1 refills | Status: AC
  Filled 2022-12-10 (×2): qty 90, 90d supply, fill #0

## 2022-12-10 MED ORDER — LORAZEPAM 1 MG PO TABS
1.0000 mg | ORAL_TABLET | Freq: Every day | ORAL | 0 refills | Status: DC | PRN
Start: 2022-12-10 — End: 2023-01-14
  Filled 2022-12-10 (×2): qty 2, 2d supply, fill #0

## 2022-12-10 MED ORDER — TRAZODONE HCL 50 MG PO TABS
50.0000 mg | ORAL_TABLET | Freq: Every evening | ORAL | 1 refills | Status: AC | PRN
  Filled 2022-12-10 (×2): qty 20, 20d supply, fill #0

## 2022-12-10 MED ORDER — FLUOXETINE HCL 10 MG PO CAPS: 10.0000 mg | ORAL_CAPSULE | Freq: Every day | ORAL | 1 refills | Status: DC

## 2022-12-10 MED ORDER — LORAZEPAM 1 MG PO TABS
1.0000 mg | ORAL_TABLET | Freq: Every day | ORAL | 0 refills | Status: DC | PRN
Start: 2022-12-10 — End: 2022-12-10

## 2022-12-10 NOTE — Assessment & Plan Note (Signed)
Symptoms still not well-controlled.  Unable to tolerate citalopram due to side effects.  Still having significant issues with sleep.  He discontinued citalopram about 2 weeks ago.  Will try fluoxetine 10 mg daily.  I am starting at a low dose given sensitivities, hopefully this will stave off side effects.  Recommended taking this in the morning.  Recommended using trazodone at a low dose, only 50 mg, only as needed at night.  Hopefully does not need every night.  We talked about the risk of serotonin syndrome and other serotonergic side effects like tachycardia, sweating, irritation.  I think keeping these medicines at low-dose and separating them farther in the day will reduce those serotonin side effects.

## 2022-12-10 NOTE — Assessment & Plan Note (Signed)
Doing much better, no alcohol use over the last 4 weeks since starting treatment for adjustment disorder.

## 2022-12-10 NOTE — Assessment & Plan Note (Signed)
Patient with moderate symptoms of fear flying.  Has a 16-hour flight coming up to [place].  Has done well with low-dose lorazepam in the past.  I prescribed 1 mg tablet, I recommended half a tablet for the first 8 hours of the flights and then the other half for the second 8 hours of the flight.  I recommended against using alcohol with this medication.  I prescribed 2 tablets for both flights.

## 2022-12-10 NOTE — Progress Notes (Signed)
Established Patient Office Visit  Subjective   Patient ID: Derek Lindsey, male    DOB: 07/10/1990  Age: 32 y.o. MRN: [id number]  Chief Complaint  Patient presents with   Medication Problem   Insomnia    HPI  32 year old person here for management of adjustment mood disorder.  Doing okay right now, but still has a heavy symptom burden.  Feeling anxious throughout the day, racing thoughts at night, trouble sleeping.  Not impacting his function at work currently.  He felt he was having some tachycardia, increased irritability, sweating at night, so he discontinued citalopram about 11 days ago thinking that it was a side effect.  He continued to use trazodone over the last 2 weeks, sometimes would increase the dose from 50 to 100 mg or 75 mg.  Started to have some grogginess the next morning with those increased doses.  Says he stopped drinking alcohol.  No recent illness, no fevers or chills.  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  Getting back into exercise, joined [employer] on [employer].  Has an upcoming trip to [place] at the end of November for a wedding, will be there for 12 days.  He is very anxious about the 2 flights, which are about 16 hours each.    Objective:     BP 124/77 (BP Location: Right Arm, Patient Position: Sitting, Cuff Size: Small)   Pulse 63   Temp 98 F (36.7 C) (Oral)   Ht 5\' 11"  (1.803 m)   Wt 157 lb (71.2 kg)   SpO2 98%   BMI 21.90 kg/m    Physical Exam  Gen: Well-appearing young man, no distress Neck: Normal thyroid Heart: Regular rate, no murmur Lungs: Unlabored, clear to auscultation throughout Extremities: Warm, no lower extremity edema Psych: Appropriate mood and affect, not depressed or anxious appearing     Assessment & Plan:   Problem List Items Addressed This Visit       High   Adjustment disorder - Primary (Chronic)    Symptoms still not well-controlled.  Unable to tolerate citalopram  due to side effects.  Still having significant issues with sleep.  He discontinued citalopram about 2 weeks ago.  Will try fluoxetine 10 mg daily.  I am starting at a low dose given sensitivities, hopefully this will stave off side effects.  Recommended taking this in the morning.  Recommended using trazodone at a low dose, only 50 mg, only as needed at night.  Hopefully does not need every night.  We talked about the risk of serotonin syndrome and other serotonergic side effects like tachycardia, sweating, irritation.  I think keeping these medicines at low-dose and separating them farther in the day will reduce those serotonin side effects.      Relevant Medications   FLUoxetine (PROZAC) 10 MG capsule   traZODone (DESYREL) 50 MG tablet     Medium    At risk alcohol consumption (Chronic)    Doing much better, no alcohol use over the last 4 weeks since starting treatment for adjustment disorder.        Unprioritized   Fear of flying    Patient with moderate symptoms of fear flying.  Has a 16-hour flight coming up to [place].  Has done well with low-dose lorazepam in the past.  I prescribed 1 mg tablet, I recommended half a tablet for the first 8 hours of the flights and then the other half for the second 8 hours of  the flight.  I recommended against using alcohol with this medication.  I prescribed 2 tablets for both flights.      Relevant Medications   LORazepam (ATIVAN) 1 MG tablet    Return in about 5 weeks (around 01/14/2023).    Tyson Alias, MD

## 2022-12-11 ENCOUNTER — Ambulatory Visit (INDEPENDENT_AMBULATORY_CARE_PROVIDER_SITE_OTHER): Payer: PRIVATE HEALTH INSURANCE | Admitting: Licensed Clinical Social Worker

## 2022-12-11 DIAGNOSIS — F4323 Adjustment disorder with mixed anxiety and depressed mood: Secondary | ICD-10-CM | POA: Diagnosis not present

## 2022-12-18 ENCOUNTER — Ambulatory Visit (INDEPENDENT_AMBULATORY_CARE_PROVIDER_SITE_OTHER): Payer: PRIVATE HEALTH INSURANCE | Admitting: Licensed Clinical Social Worker

## 2022-12-18 DIAGNOSIS — F4323 Adjustment disorder with mixed anxiety and depressed mood: Secondary | ICD-10-CM

## 2022-12-20 ENCOUNTER — Other Ambulatory Visit (HOSPITAL_COMMUNITY): Payer: Self-pay

## 2022-12-24 ENCOUNTER — Ambulatory Visit (INDEPENDENT_AMBULATORY_CARE_PROVIDER_SITE_OTHER): Payer: PRIVATE HEALTH INSURANCE | Admitting: Licensed Clinical Social Worker

## 2022-12-24 ENCOUNTER — Encounter (HOSPITAL_COMMUNITY): Payer: Self-pay | Admitting: Licensed Clinical Social Worker

## 2022-12-24 DIAGNOSIS — F4323 Adjustment disorder with mixed anxiety and depressed mood: Secondary | ICD-10-CM

## 2022-12-24 NOTE — Progress Notes (Unsigned)
  THERAPIST PROGRESS NOTE   Virtual Visit via Video Note   I connected with Jannifer Hick on 12/11/22 at 2:00 PM EST by a video enabled telemedicine application and verified that I am speaking with the correct person using two identifiers.   Location: Patient: Home Provider: Home Office   I discussed the limitations of evaluation and management by telemedicine and the availability of in person appointments. The patient expressed understanding and agreed to proceed.    Session Time: 2:00-3:00 pm  Participation Level: Active  Behavioral Response: CasualAlertAnxious  Type of Therapy: Individual Therapy  Treatment Goals addressed: Meet with clinician in person x1 every 2 weeks for therapy to monitor for progress towards goals and address any barriers to success; Reduce depression from average severity level of 6/10 down to a 4/10 in next 90 days by engaging in 3-5 positive coping skills for 1-2 hours per day as part of developing self-care routine; Reduce average anxiety level from 7/10 down to 5/10 in next 90 days by utilizing 3-5 relaxation skills/grounding skills per day, such as mindful breathing, progressive muscle relaxation, positive visualizations  ProgressTowards Goals: Progressing  Interventions: Supportive  Summary: Patient presented for today's session on time and was alert, oriented x5, with no evidence or self-report of SI/HI or A/V H.  Patient reported ongoing compliance with medication.  Clinician inquired about patient's current emotional ratings, as well as any significant changes in thoughts, feelings or behavior since previous session.  Patient reported scores of 8/10 for depression, 7/10 for anxiety, 2/10 for anger/irritability, and denied any panic attacks. Patient reports, "Im still sad, angry, frustrated, hardly able to function. I'm glad I'm back at work and trying to change my focus on trying to vibe with my coworkers for more social interaction, which is a good  thing."Cln asked open ended questions. Pt continues to talk about his recent breakup and continues to struggle with adjusting to his current life, without being in a relationship. " Cln used CBT to assist in identifying positives to assess how his mood has been impacted. Clinician allowed space for patient to further process emotions and clinician provided supportive statements throughout session. It was suggested to patient to identify positives that  impact mood daily.      Plan: Return again in 1 week.  Assessment and plan: Counselor will continue to meet with patient to address treatment plan goals. Patient will continue to follow recommendations of providers and implement skills learned in session. Diagnosis: Adjustment disorder with mixed anxiety and depressed mood  Diagnosis: Adjustment disorder with mixed anxiety and depressed mood  Collaboration of Care: Meet with ongoing providers  Patient/Guardian was advised Release of Information must be obtained prior to any record release in order to collaborate their care with an outside provider. Patient/Guardian was advised if they have not already done so to contact the registration department to sign all necessary forms in order for [place] to release information regarding their care.   Consent: Patient/Guardian gives verbal consent for treatment and assignment of benefits for services provided during this visit. Patient/Guardian expressed understanding and agreed to proceed.   Milliana Reddoch S, LCAS 12/11/23

## 2022-12-28 ENCOUNTER — Encounter (HOSPITAL_COMMUNITY): Payer: Self-pay | Admitting: Licensed Clinical Social Worker

## 2022-12-28 NOTE — Progress Notes (Signed)
  THERAPIST PROGRESS NOTE   Virtual Visit via Video Note   I connected with Jannifer Hick on 12/18/22 at 11:00-12:00 PM EST by a video enabled telemedicine application and verified that I am speaking with the correct person using two identifiers.   Location: Patient: Home Provider: Home Office   I discussed the limitations of evaluation and management by telemedicine and the availability of in person appointments. The patient expressed understanding and agreed to proceed.    Session Time: 11-12:00 pm  Participation Level: Active  Behavioral Response: CasualAlertAnxious  Type of Therapy: Individual Therapy  Treatment Goals addressed: Meet with clinician in person x1 every 2 weeks for therapy to monitor for progress towards goals and address any barriers to success; Reduce depression from average severity level of 6/10 down to a 4/10 in next 90 days by engaging in 3-5 positive coping skills for 1-2 hours per day as part of developing self-care routine; Reduce average anxiety level from 7/10 down to 5/10 in next 90 days by utilizing 3-5 relaxation skills/grounding skills per day, such as mindful breathing, progressive muscle relaxation, positive visualizations  ProgressTowards Goals: Progressing  Interventions: Supportive  Summary: Patient presented for today's session on time and was alert, oriented x5, with no evidence or self-report of SI/HI or A/V H.  Patient reported ongoing compliance with medication.  Clinician inquired about patient's current emotional ratings, as well as any significant changes in thoughts, feelings or behavior since previous session.  Patient reported scores of 8/10 for depression, 7/10 for anxiety, 2/10 for anger/irritability, and denied any panic attacks. Patient reports, "Im still sad, angry, frustrated, hardly able to function. I'm glad I'm back at work and trying to change my focus on trying to vibe with my coworkers for more social interaction, which is a good  thing."Cln asked open ended questions. Pt continues to talk about his recent breakup and continues to struggle with adjusting to his current life, without being in a relationship. "Cln provided psychoeducation on using mindfulness-based stress response to assist patient in lowering the stress response. Patient was encouraged to use mindfulness skills as a coping skill to assist in lowering stress response, instead of other negative coping skills.   Plan: Return again in 1 week.  Assessment and plan: Counselor will continue to meet with patient to address treatment plan goals. Patient will continue to follow recommendations of providers and implement skills learned in session. Diagnosis: Adjustment disorder with mixed anxiety and depressed mood  Diagnosis: Adjustment disorder with mixed anxiety and depressed mood  Collaboration of Care: Meet with ongoing providers  Patient/Guardian was advised Release of Information must be obtained prior to any record release in order to collaborate their care with an outside provider. Patient/Guardian was advised if they have not already done so to contact the registration department to sign all necessary forms in order for [place] to release information regarding their care.   Consent: Patient/Guardian gives verbal consent for treatment and assignment of benefits for services provided during this visit. Patient/Guardian expressed understanding and agreed to proceed.   Shyleigh Daughtry S, LCAS 12/18/22

## 2022-12-29 ENCOUNTER — Encounter (HOSPITAL_COMMUNITY): Payer: Self-pay | Admitting: Licensed Clinical Social Worker

## 2022-12-29 NOTE — Progress Notes (Signed)
  THERAPIST PROGRESS NOTE   Virtual Visit via Video Note   I connected with Derek Lindsey on 12/24/22 at 4-5pm EST by a video enabled telemedicine application and verified that I am speaking with the correct person using two identifiers.   Location: Patient: Home Provider: Home Office   I discussed the limitations of evaluation and management by telemedicine and the availability of in person appointments. The patient expressed understanding and agreed to proceed.    Session Time: 4-5pm  Participation Level: Active  Behavioral Response: CasualAlertAnxious  Type of Therapy: Individual Therapy  Treatment Goals addressed: Meet with clinician in person weekly for therapy to monitor for progress towards goals and address any barriers to success; Reduce depression from average severity level of 6/10 down to a 4/10 in next 90 days by engaging in 3-5 positive coping skills for 1-2 hours per day as part of developing self-care routine; Reduce average anxiety level from 7/10 down to 5/10 in next 90 days by utilizing 3-5 relaxation skills/grounding skills per day, such as mindful breathing, progressive muscle relaxation, positive visualizations  ProgressTowards Goals: Progressing  Interventions: Supportive  Summary: Patient presented for today's session on time and was alert, oriented x5, with no evidence or self-report of SI/HI or A/V H.  Patient reported ongoing compliance with medication.  Clinician inquired about patient's current emotional ratings, as well as any significant changes in thoughts, feelings or behavior since previous session.  Patient reported scores of 8/10 for depression, 7/10 for anxiety, 2/10 for anger/irritability, and denied any panic attacks. Patient reports, "Im still sad, angry, frustrated, hardly able to function. I'm glad I'm back at work and trying to change my focus on trying to vibe with my coworkers for more social interaction, which is a good thing."Cln asked open  ended questions. Pt continues to talk about his recent breakup and continues to struggle with adjusting to his current life, without being in a relationship. Pt spoke of his holiday plans difficulty while not in a relationship. Cln asked open ended questions. Pt leaves for [place] on Thanksgiving Day for 2 weeks, attending a wedding. Cln and pt reviewed mindfulness based coping tools to use while in a new country and being on an airplane for 24 hours, all which exacerbate his anxiety.    Plan: Return again upon return from trip.  Assessment and plan: Counselor will continue to meet with patient to address treatment plan goals. Patient will continue to follow recommendations of providers and implement skills learned in session. Diagnosis: Adjustment disorder with mixed anxiety and depressed mood  Diagnosis: Adjustment disorder with mixed anxiety and depressed mood  Collaboration of Care: Meet with ongoing providers  Patient/Guardian was advised Release of Information must be obtained prior to any record release in order to collaborate their care with an outside provider. Patient/Guardian was advised if they have not already done so to contact the registration department to sign all necessary forms in order for [place] to release information regarding their care.   Consent: Patient/Guardian gives verbal consent for treatment and assignment of benefits for services provided during this visit. Patient/Guardian expressed understanding and agreed to proceed.   Darrion Wyszynski S, LCAS 12/24/22

## 2023-01-09 ENCOUNTER — Ambulatory Visit (HOSPITAL_COMMUNITY): Payer: PRIVATE HEALTH INSURANCE | Admitting: Licensed Clinical Social Worker

## 2023-01-09 ENCOUNTER — Encounter (HOSPITAL_COMMUNITY): Payer: Self-pay

## 2023-01-10 ENCOUNTER — Ambulatory Visit (INDEPENDENT_AMBULATORY_CARE_PROVIDER_SITE_OTHER): Payer: PRIVATE HEALTH INSURANCE | Admitting: Licensed Clinical Social Worker

## 2023-01-10 DIAGNOSIS — F4323 Adjustment disorder with mixed anxiety and depressed mood: Secondary | ICD-10-CM | POA: Diagnosis not present

## 2023-01-14 ENCOUNTER — Encounter: Payer: Self-pay | Admitting: Student in an Organized Health Care Education/Training Program

## 2023-01-14 ENCOUNTER — Other Ambulatory Visit (HOSPITAL_COMMUNITY): Payer: Self-pay

## 2023-01-14 ENCOUNTER — Ambulatory Visit (INDEPENDENT_AMBULATORY_CARE_PROVIDER_SITE_OTHER): Payer: PRIVATE HEALTH INSURANCE | Admitting: Student in an Organized Health Care Education/Training Program

## 2023-01-14 VITALS — BP 119/69 | HR 61 | Temp 97.5°F | Ht 71.0 in | Wt 166.3 lb

## 2023-01-14 DIAGNOSIS — F4321 Adjustment disorder with depressed mood: Secondary | ICD-10-CM | POA: Diagnosis not present

## 2023-01-14 DIAGNOSIS — Z91018 Allergy to other foods: Secondary | ICD-10-CM | POA: Insufficient documentation

## 2023-01-14 MED ORDER — EPINEPHRINE 0.3 MG/0.3ML IJ SOAJ
0.3000 mg | INTRAMUSCULAR | 2 refills | Status: AC | PRN
  Filled 2023-01-14: qty 2, 2d supply, fill #0

## 2023-01-14 NOTE — Assessment & Plan Note (Signed)
Much better controlled today.  PHQ-9 score of 3.  Tolerating fluoxetine well with no side effects.  Plan to continue fluoxetine 10 mg once daily.  Sleep is also much better now that we have good control over the underlying mood disorder.  He has rarely needed to use trazodone.

## 2023-01-14 NOTE — Assessment & Plan Note (Signed)
Experienced an episode of anaphylaxis after a tree nut exposure.  He treated this with 2 doses of IM epinephrine.  He had symptoms of his skin, respiratory, and mucocutaneous.  Did well after treatments, did not need hospital based care.  Reports having allergy testing in the past and was identified to have allergies to avocado, kiwi, and tree nuts.  Usually does a good job avoiding tree nut exposures.  This exposure came from the coffee on a transcontinental flight.  I prescribed the patient EpiPen to keep with him in case he has another accidental exposure in the future.

## 2023-01-14 NOTE — Progress Notes (Signed)
   Established Patient Office Visit  Subjective   Patient ID: Derek Lindsey, male    DOB: 02-15-1990  Age: 32 y.o. MRN: [id number]  Chief Complaint  Patient presents with   Follow-up    HPI  32 year old person here for management of moderate adjustment disorder.  Doing very well since I last saw him about 6 weeks ago.  He had a 9-day trip to [place] for a wedding which went really well.  His mood has been much better, quality of sleep much better.  He has very few symptoms now of anxiety or depression.  Not using trazodone very much.  Continues to work some day shifts and night shifts, exercising regularly.  Drinking alcohol occasionally, socially.  He did report having an episode of anaphylaxis during the airplane flight to [place].  He thinks it was a tree nut exposure from coffee on the airplane.  After drinking coffee he experienced flushing of his face, swelling of his lips, and some respiratory symptoms that felt like his throat was closing.  He treated himself with 2 doses of epinephrine intramuscularly, in addition to Benadryl and cetirizine, and was able to feel better after a few hours.  Did not need to go to the hospital.  Has been many years since he has had an anaphylaxis reaction, does not usually carry an EpiPen with him but he does avoid tree nut exposures.    Objective:     BP 119/69 (BP Location: Right Arm, Patient Position: Sitting, Cuff Size: Small)   Pulse 61   Temp (!) 97.5 F (36.4 C) (Oral)   Ht 5\' 11"  (1.803 m)   Wt 166 lb 4.8 oz (75.4 kg)   SpO2 97%   BMI 23.19 kg/m    Physical Exam  Gen: Well-appearing man, no distress Neck: Normal thyroid, no lymphadenopathy Lungs: Unlabored, clear to auscultation throughout Heart: Regular rate and rhythm, no murmurs Psych: Appropriate mood and affect, not depressed or anxious appearing    Assessment & Plan:   Problem List Items Addressed This Visit       High   Adjustment disorder (Chronic)   Much  better controlled today.  PHQ-9 score of 3.  Tolerating fluoxetine well with no side effects.  Plan to continue fluoxetine 10 mg once daily.  Sleep is also much better now that we have good control over the underlying mood disorder.  He has rarely needed to use trazodone.        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  He treated this with 2 doses of IM epinephrine.  He had symptoms of his skin, respiratory, and mucocutaneous.  Did well after treatments, did not need hospital based care.  Reports having allergy testing in the past and was identified to have allergies to avocado, kiwi, and tree nuts.  Usually does a good job avoiding tree nut exposures.  This exposure came from the coffee on a transcontinental flight.  I prescribed the patient EpiPen to keep with him in case he has another accidental exposure in the future.       Return in about 6 months (around 07/15/2023).    Tyson Alias, MD

## 2023-01-15 ENCOUNTER — Encounter (HOSPITAL_COMMUNITY): Payer: Self-pay

## 2023-01-15 ENCOUNTER — Ambulatory Visit (HOSPITAL_COMMUNITY): Payer: PRIVATE HEALTH INSURANCE | Admitting: Licensed Clinical Social Worker

## 2023-01-24 ENCOUNTER — Other Ambulatory Visit (HOSPITAL_COMMUNITY): Payer: Self-pay

## 2023-01-28 NOTE — Progress Notes (Unsigned)
  THERAPIST PROGRESS NOTE   Virtual Visit via Video Note   I connected with Derek Lindsey on 01/10/23 at 2-2:30pm EST by a video enabled telemedicine application and verified that I am speaking with the correct person using two identifiers.   Location: Patient: Home Provider: Home Office   I discussed the limitations of evaluation and management by telemedicine and the availability of in person appointments. The patient expressed understanding and agreed to proceed.    Session Time: 2-3pm  Participation Level: Active  Behavioral Response: CasualAlertAnxious  Type of Therapy: Individual Therapy  Treatment Goals addressed: Meet with clinician in person weekly for therapy to monitor for progress towards goals and address any barriers to success; Reduce depression from average severity level of 6/10 down to a 4/10 in next 90 days by engaging in 3-5 positive coping skills for 1-2 hours per day as part of developing self-care routine; Reduce average anxiety level from 7/10 down to 5/10 in next 90 days by utilizing 3-5 relaxation skills/grounding skills per day, such as mindful breathing, progressive muscle relaxation, positive visualizations  ProgressTowards Goals: Progressing  Interventions: Supportive  Summary: Patient presented for today's session on time and was alert, oriented x5, with no evidence or self-report of SI/HI or A/V H.  Patient reported ongoing compliance with medication.  Clinician inquired about patient's current emotional ratings, as well as any significant changes in thoughts, feelings or behavior since previous session.  Patient reported scores of 8/10 for depression, 7/10 for anxiety, 2/10 for anger/irritability, and denied any panic attacks. Patient reports, "Im still sad, angry, frustrated, hardly able to function due to breakup with x girlfriend.. I'm  back from [place] where I had a wonderful time. Cln asked open ended questions.Pt talked about his upcoming holiday  plans, seeing his family, seeing friends. Pt practiced his mindfulness based coping skills to use at Christmas with family.     Plan: Return again upon return from trip.  Assessment and plan: Counselor will continue to meet with patient to address treatment plan goals. Patient will continue to follow recommendations of providers and implement skills learned in session. Diagnosis: Adjustment disorder with mixed anxiety and depressed mood  Diagnosis: Adjustment disorder with mixed anxiety and depressed mood  Collaboration of Care: Meet with ongoing providers  Patient/Guardian was advised Release of Information must be obtained prior to any record release in order to collaborate their care with an outside provider. Patient/Guardian was advised if they have not already done so to contact the registration department to sign all necessary forms in order for [place] to release information regarding their care.   Consent: Patient/Guardian gives verbal consent for treatment and assignment of benefits for services provided during this visit. Patient/Guardian expressed understanding and agreed to proceed.   Rayfield Beem S, LCAS 01/10/23

## 2023-01-29 ENCOUNTER — Ambulatory Visit (HOSPITAL_COMMUNITY): Payer: PRIVATE HEALTH INSURANCE | Admitting: Licensed Clinical Social Worker

## 2023-01-29 DIAGNOSIS — F4323 Adjustment disorder with mixed anxiety and depressed mood: Secondary | ICD-10-CM | POA: Diagnosis not present

## 2023-02-02 ENCOUNTER — Encounter (HOSPITAL_COMMUNITY): Payer: Self-pay | Admitting: Licensed Clinical Social Worker

## 2023-02-05 ENCOUNTER — Ambulatory Visit (INDEPENDENT_AMBULATORY_CARE_PROVIDER_SITE_OTHER): Payer: PRIVATE HEALTH INSURANCE | Admitting: Licensed Clinical Social Worker

## 2023-02-05 DIAGNOSIS — F4323 Adjustment disorder with mixed anxiety and depressed mood: Secondary | ICD-10-CM | POA: Diagnosis not present

## 2023-02-06 ENCOUNTER — Encounter (HOSPITAL_COMMUNITY): Payer: Self-pay | Admitting: Licensed Clinical Social Worker

## 2023-02-06 NOTE — Progress Notes (Signed)
  THERAPIST PROGRESS NOTE   Virtual Visit via Video Note   I connected with Medford Lites on 01/29/23 at 2-3:00pm EST by a video enabled telemedicine application and verified that I am speaking with the correct person using two identifiers.   Location: Patient: Home Provider: Home Office   I discussed the limitations of evaluation and management by telemedicine and the availability of in person appointments. The patient expressed understanding and agreed to proceed.    Session Time: 2-3pm  Participation Level: Active  Behavioral Response: CasualAlertAnxious  Type of Therapy: Individual Therapy  Treatment Goals addressed: Meet with clinician in person weekly for therapy to monitor for progress towards goals and address any barriers to success; Reduce depression from average severity level of 6/10 down to a 4/10 in next 90 days by engaging in 3-5 positive coping skills for 1-2 hours per day as part of developing self-care routine; Reduce average anxiety level from 7/10 down to 5/10 in next 90 days by utilizing 3-5 relaxation skills/grounding skills per day, such as mindful breathing, progressive muscle relaxation, positive visualizations  ProgressTowards Goals: Progressing  Interventions: Supportive  Summary: Patient presented for today's session on time and was alert, oriented x5, with no evidence or self-report of SI/HI or A/V H.  Patient reported ongoing compliance with medication.  Clinician inquired about patient's current emotional ratings, as well as any significant changes in thoughts, feelings or behavior since previous session.  Patient reported scores of 8/10 for depression, 7/10 for anxiety, 2/10 for anger/irritability, and denied any panic attacks. Patient reports, Im still sad, angry, frustrated, hardly able to function due to breakup with x girlfriend, however I'm starting to hang out with friends and some periodic dates. Cln asked open ended questions. Pt reported on his  holidays, being with family. . Clinician allowed space for further processing of emotions and provided supportive statements throughout the session. Patient was encouraged to identify positives in his life to improve mood.     Plan: Return again upon return from trip.  Assessment and plan: Counselor will continue to meet with patient to address treatment plan goals. Patient will continue to follow recommendations of providers and implement skills learned in session. Diagnosis: Adjustment disorder with mixed anxiety and depressed mood  Diagnosis: Adjustment disorder with mixed anxiety and depressed mood  Collaboration of Care: Meet with ongoing providers  Patient/Guardian was advised Release of Information must be obtained prior to any record release in order to collaborate their care with an outside provider. Patient/Guardian was advised if they have not already done so to contact the registration department to sign all necessary forms in order for us  to release information regarding their care.   Consent: Patient/Guardian gives verbal consent for treatment and assignment of benefits for services provided during this visit. Patient/Guardian expressed understanding and agreed to proceed.   Anabel Lykins S, LCAS 01/29/23

## 2023-02-13 ENCOUNTER — Ambulatory Visit (HOSPITAL_COMMUNITY): Payer: PRIVATE HEALTH INSURANCE | Admitting: Licensed Clinical Social Worker

## 2023-02-17 ENCOUNTER — Encounter (HOSPITAL_COMMUNITY): Payer: Self-pay | Admitting: Licensed Clinical Social Worker

## 2023-02-17 NOTE — Progress Notes (Signed)
  THERAPIST PROGRESS NOTE   Virtual Visit via Video Note   I connected with Derek Lindsey on 02/05/23 at 4-5pm EST by a video enabled telemedicine application and verified that I am speaking with the correct person using two identifiers.   Location: Patient: Home Provider: Home Office   I discussed the limitations of evaluation and management by telemedicine and the availability of in person appointments. The patient expressed understanding and agreed to proceed.    Session Time: 4-5pm  Participation Level: Active  Behavioral Response: CasualAlertAnxious  Type of Therapy: Individual Therapy  Treatment Goals addressed: Meet with clinician in person weekly for therapy to monitor for progress towards goals and address any barriers to success; Reduce depression from average severity level of 6/10 down to a 4/10 in next 90 days by engaging in 3-5 positive coping skills for 1-2 hours per day as part of developing self-care routine; Reduce average anxiety level from 7/10 down to 5/10 in next 90 days by utilizing 3-5 relaxation skills/grounding skills per day, such as mindful breathing, progressive muscle relaxation, positive visualizations  ProgressTowards Goals: Progressing  Interventions: Supportive  Summary: Patient presented for today's session on time and was alert, oriented x5, with no evidence or self-report of SI/HI or A/V H.  Patient reported ongoing compliance with medication.  Clinician inquired about patient's current emotional ratings, as well as any significant changes in thoughts, feelings or behavior since previous session.  Patient reported scores of 8/10 for depression, 7/10 for anxiety, 2/10 for anger/irritability, and denied any panic attacks. Patient reports, "I continue to experience symptoms from my breakup but I'm trying to move on with my life, continuing to hang out with friends and some periodic dates. Cln asked open ended questions. Pt reported on his holidays, being with  family, which proved to stressful, disappointing. Cln asked open ended questions where patient shared about family christmas. Clinician utilized CBT to process thoughts, feelings, and behaviors.    Assessment and plan: Counselor will continue to meet with patient to address treatment plan goals. Patient will continue to follow recommendations of providers and implement skills learned in session. Diagnosis: Adjustment disorder with mixed anxiety and depressed mood  Diagnosis: Adjustment disorder with mixed anxiety and depressed mood  Collaboration of Care: Meet with ongoing providers  Patient/Guardian was advised Release of Information must be obtained prior to any record release in order to collaborate their care with an outside provider. Patient/Guardian was advised if they have not already done so to contact the registration department to sign all necessary forms in order for [place] to release information regarding their care.   Consent: Patient/Guardian gives verbal consent for treatment and assignment of benefits for services provided during this visit. Patient/Guardian expressed understanding and agreed to proceed.   Day Greb S, LCAS 02/05/23

## 2023-02-19 ENCOUNTER — Ambulatory Visit (HOSPITAL_COMMUNITY): Payer: PRIVATE HEALTH INSURANCE | Admitting: Licensed Clinical Social Worker

## 2023-02-26 ENCOUNTER — Ambulatory Visit (INDEPENDENT_AMBULATORY_CARE_PROVIDER_SITE_OTHER): Payer: PRIVATE HEALTH INSURANCE | Admitting: Licensed Clinical Social Worker

## 2023-02-26 DIAGNOSIS — F4323 Adjustment disorder with mixed anxiety and depressed mood: Secondary | ICD-10-CM | POA: Diagnosis not present

## 2023-03-01 ENCOUNTER — Encounter (HOSPITAL_COMMUNITY): Payer: Self-pay | Admitting: Licensed Clinical Social Worker

## 2023-03-01 NOTE — Progress Notes (Signed)
  THERAPIST PROGRESS NOTE   Virtual Visit via Video Note   I connected with Derek Lindsey on 02/26/23 at 12-1pm EST by a video enabled telemedicine application and verified that I am speaking with the correct person using two identifiers.   Location: Patient: Home Provider: Home Office   I discussed the limitations of evaluation and management by telemedicine and the availability of in person appointments. The patient expressed understanding and agreed to proceed.    Session Time: 12-1pm  Participation Level: Active  Behavioral Response: CasualAlertAnxious  Type of Therapy: Individual Therapy  Treatment Goals addressed: Meet with clinician in person weekly for therapy to monitor for progress towards goals and address any barriers to success; Reduce depression from average severity level of 6/10 down to a 4/10 in next 90 days by engaging in 3-5 positive coping skills for 1-2 hours per day as part of developing self-care routine; Reduce average anxiety level from 7/10 down to 5/10 in next 90 days by utilizing 3-5 relaxation skills/grounding skills per day, such as mindful breathing, progressive muscle relaxation, positive visualizations  ProgressTowards Goals: Progressing  Interventions: Supportive  Summary: Patient presented for today's session on time and was alert, oriented x5, with no evidence or self-report of SI/HI or A/V H.  Patient reported ongoing compliance with medication.  Clinician inquired about patient's current emotional ratings, as well as any significant changes in thoughts, feelings or behavior since previous session.  Patient reported scores of 8/10 for depression, 7/10 for anxiety, 2/10 for anger/irritability, and denied any panic attacks. Patient reports on his stressors: family relationships, work, making new friend set in [place], thinking about buying a house, relationship. Cln asked open ended questions. Pt talked about his current dating life vs his x girlfriend.  "I felt traumatized by that relationship." Cln asked questions about his trauma symptoms and will continue to monitor his symptoms. Pt reports he wants to talk to his insurance co to find out if [facility] is out of network and will call to make additional appts.    Assessment and plan: Pt will call to make additional appts after he talks to his insurance co. Counselor will continue to meet with patient to address treatment plan goals. Patient will continue to follow recommendations of providers and implement skills learned in session. Diagnosis: Adjustment disorder with mixed anxiety and depressed mood  Diagnosis: Adjustment disorder with mixed anxiety and depressed mood  Collaboration of Care: Meet with ongoing providers  Patient/Guardian was advised Release of Information must be obtained prior to any record release in order to collaborate their care with an outside provider. Patient/Guardian was advised if they have not already done so to contact the registration department to sign all necessary forms in order for [place] to release information regarding their care.   Consent: Patient/Guardian gives verbal consent for treatment and assignment of benefits for services provided during this visit. Patient/Guardian expressed understanding and agreed to proceed.   Bertrum Helmstetter S, LCAS 02/26/23

## 2023-03-20 ENCOUNTER — Other Ambulatory Visit (HOSPITAL_COMMUNITY): Payer: Self-pay

## 2023-03-20 MED ORDER — ONDANSETRON 4 MG PO TBDP
4.0000 mg | ORAL_TABLET | Freq: Three times a day (TID) | ORAL | 1 refills | Status: AC | PRN
  Filled 2023-03-20: qty 20, 7d supply, fill #0
